# Patient Record
Sex: Male | Born: 1975
Health system: Southern US, Community
[De-identification: ages and names within clinical notes are randomized; demographics above are authoritative.]

## PROBLEM LIST (undated history)

## (undated) DIAGNOSIS — Q859 Phakomatosis, unspecified: Secondary | ICD-10-CM

## (undated) DIAGNOSIS — R030 Elevated blood-pressure reading, without diagnosis of hypertension: Secondary | ICD-10-CM

## (undated) DIAGNOSIS — K589 Irritable bowel syndrome without diarrhea: Secondary | ICD-10-CM

## (undated) DIAGNOSIS — G4700 Insomnia, unspecified: Secondary | ICD-10-CM

## (undated) DIAGNOSIS — E785 Hyperlipidemia, unspecified: Secondary | ICD-10-CM

## (undated) HISTORY — DX: Phakomatosis, unspecified: Q85.9

## (undated) HISTORY — PX: LUNG BIOPSY: SHX232

## (undated) HISTORY — DX: Elevated blood-pressure reading, without diagnosis of hypertension: R03.0

## (undated) HISTORY — DX: Irritable bowel syndrome, unspecified: K58.9

## (undated) HISTORY — DX: Hyperlipidemia, unspecified: E78.5

## (undated) HISTORY — DX: Insomnia, unspecified: G47.00

## (undated) HISTORY — PX: SIGMOIDOSCOPY: SUR1295

## (undated) HISTORY — PX: COLONOSCOPY: SHX174

---

## 2004-05-07 ENCOUNTER — Ambulatory Visit: Payer: Self-pay | Admitting: Internal Medicine

## 2004-09-05 ENCOUNTER — Ambulatory Visit: Payer: Self-pay | Admitting: Gastroenterology

## 2004-09-17 ENCOUNTER — Ambulatory Visit: Payer: Self-pay | Admitting: Gastroenterology

## 2004-10-11 ENCOUNTER — Ambulatory Visit (HOSPITAL_COMMUNITY): Admission: RE | Admit: 2004-10-11 | Discharge: 2004-10-11 | Payer: Self-pay | Admitting: Gastroenterology

## 2004-10-11 ENCOUNTER — Ambulatory Visit: Payer: Self-pay | Admitting: Gastroenterology

## 2004-10-11 ENCOUNTER — Encounter (INDEPENDENT_AMBULATORY_CARE_PROVIDER_SITE_OTHER): Payer: Self-pay | Admitting: Specialist

## 2005-02-26 ENCOUNTER — Ambulatory Visit: Payer: Self-pay | Admitting: Family Medicine

## 2005-04-04 ENCOUNTER — Ambulatory Visit: Payer: Self-pay | Admitting: Family Medicine

## 2005-09-03 ENCOUNTER — Ambulatory Visit: Payer: Self-pay | Admitting: Family Medicine

## 2006-04-09 LAB — HM COLONOSCOPY: HM Colonoscopy: NORMAL

## 2006-08-29 ENCOUNTER — Ambulatory Visit: Payer: Self-pay | Admitting: Family Medicine

## 2006-08-29 LAB — CONVERTED CEMR LAB
ALT: 47 units/L — ABNORMAL HIGH (ref 0–40)
AST: 32 units/L (ref 0–37)
Albumin: 4.2 g/dL (ref 3.5–5.2)
Alkaline Phosphatase: 68 units/L (ref 39–117)
BUN: 15 mg/dL (ref 6–23)
Basophils Absolute: 0 10*3/uL (ref 0.0–0.1)
Basophils Relative: 0.7 % (ref 0.0–1.0)
Bilirubin, Direct: 0.1 mg/dL (ref 0.0–0.3)
CO2: 31 meq/L (ref 19–32)
Calcium: 9.6 mg/dL (ref 8.4–10.5)
Chloride: 104 meq/L (ref 96–112)
Cholesterol: 275 mg/dL (ref 0–200)
Creatinine, Ser: 0.9 mg/dL (ref 0.4–1.5)
Direct LDL: 194.5 mg/dL
Eosinophils Absolute: 0.1 10*3/uL (ref 0.0–0.6)
Eosinophils Relative: 1.3 % (ref 0.0–5.0)
GFR calc Af Amer: 127 mL/min
GFR calc non Af Amer: 105 mL/min
Glucose, Bld: 104 mg/dL — ABNORMAL HIGH (ref 70–99)
HCT: 45.3 % (ref 39.0–52.0)
HDL: 39.6 mg/dL (ref >39.0)
Hemoglobin: 15.6 g/dL (ref 13.0–17.0)
Lymphocytes Relative: 25 % (ref 12.0–46.0)
MCHC: 34.3 g/dL (ref 30.0–36.0)
MCV: 90.2 fL (ref 78.0–100.0)
Monocytes Absolute: 0.5 10*3/uL (ref 0.2–0.7)
Monocytes Relative: 9.2 % (ref 3.0–11.0)
Neutro Abs: 3.1 10*3/uL (ref 1.4–7.7)
Neutrophils Relative %: 63.8 % (ref 43.0–77.0)
Platelets: 243 10*3/uL (ref 150–400)
Potassium: 4.3 meq/L (ref 3.5–5.1)
RBC: 5.03 M/uL (ref 4.22–5.81)
RDW: 12.6 % (ref 11.5–14.6)
Sodium: 141 meq/L (ref 135–145)
TSH: 1.32 microintl units/mL (ref 0.35–5.50)
Total Bilirubin: 0.9 mg/dL (ref 0.3–1.2)
Total CHOL/HDL Ratio: 6.9
Total Protein: 7.4 g/dL (ref 6.0–8.3)
Triglycerides: 182 mg/dL — ABNORMAL HIGH (ref 0–149)
VLDL: 36 mg/dL (ref 0–40)
WBC: 4.9 10*3/uL (ref 4.5–10.5)

## 2007-04-06 ENCOUNTER — Ambulatory Visit: Payer: Self-pay | Admitting: Internal Medicine

## 2007-04-06 DIAGNOSIS — R030 Elevated blood-pressure reading, without diagnosis of hypertension: Secondary | ICD-10-CM | POA: Insufficient documentation

## 2007-05-12 ENCOUNTER — Ambulatory Visit: Payer: Self-pay | Admitting: Family Medicine

## 2007-05-12 DIAGNOSIS — E785 Hyperlipidemia, unspecified: Secondary | ICD-10-CM | POA: Insufficient documentation

## 2007-05-12 DIAGNOSIS — E782 Mixed hyperlipidemia: Secondary | ICD-10-CM

## 2007-05-20 ENCOUNTER — Ambulatory Visit: Payer: Self-pay | Admitting: Internal Medicine

## 2007-05-20 LAB — CONVERTED CEMR LAB
ALT: 49 units/L (ref 0–53)
AST: 33 units/L (ref 0–37)
Albumin: 4.3 g/dL (ref 3.5–5.2)
Alkaline Phosphatase: 64 units/L (ref 39–117)
Bilirubin, Direct: 0.1 mg/dL (ref 0.0–0.3)
Cholesterol: 266 mg/dL (ref 0–200)
Direct LDL: 202.2 mg/dL
HDL: 42.4 mg/dL (ref >39.0)
Total Bilirubin: 1 mg/dL (ref 0.3–1.2)
Total CHOL/HDL Ratio: 6.3
Total Protein: 7.8 g/dL (ref 6.0–8.3)
Triglycerides: 139 mg/dL (ref 0–149)
VLDL: 28 mg/dL (ref 0–40)

## 2007-05-26 ENCOUNTER — Ambulatory Visit: Payer: Self-pay | Admitting: Family Medicine

## 2007-06-06 ENCOUNTER — Emergency Department (HOSPITAL_COMMUNITY): Admission: EM | Admit: 2007-06-06 | Discharge: 2007-06-06 | Payer: Self-pay | Admitting: Emergency Medicine

## 2007-10-27 ENCOUNTER — Encounter (INDEPENDENT_AMBULATORY_CARE_PROVIDER_SITE_OTHER): Payer: Self-pay | Admitting: *Deleted

## 2007-11-23 ENCOUNTER — Ambulatory Visit: Payer: Self-pay | Admitting: Family Medicine

## 2007-11-30 LAB — CONVERTED CEMR LAB
ALT: 46 U/L
AST: 28 U/L
Albumin: 4.3 g/dL
Alkaline Phosphatase: 61 U/L
Bilirubin, Direct: 0.1 mg/dL
Cholesterol: 248 mg/dL
Direct LDL: 165.7 mg/dL
HDL: 30.9 mg/dL — ABNORMAL LOW
Total Bilirubin: 1 mg/dL
Total CHOL/HDL Ratio: 8
Total Protein: 7.6 g/dL
Triglycerides: 313 mg/dL
VLDL: 63 mg/dL — ABNORMAL HIGH

## 2007-12-01 ENCOUNTER — Encounter (INDEPENDENT_AMBULATORY_CARE_PROVIDER_SITE_OTHER): Payer: Self-pay | Admitting: *Deleted

## 2007-12-22 ENCOUNTER — Ambulatory Visit: Payer: Self-pay | Admitting: Family Medicine

## 2008-02-15 ENCOUNTER — Ambulatory Visit: Payer: Self-pay | Admitting: Family Medicine

## 2008-03-03 ENCOUNTER — Encounter (INDEPENDENT_AMBULATORY_CARE_PROVIDER_SITE_OTHER): Payer: Self-pay | Admitting: *Deleted

## 2008-03-03 ENCOUNTER — Telehealth (INDEPENDENT_AMBULATORY_CARE_PROVIDER_SITE_OTHER): Payer: Self-pay | Admitting: *Deleted

## 2008-04-11 ENCOUNTER — Ambulatory Visit: Payer: Self-pay | Admitting: Family Medicine

## 2008-04-11 DIAGNOSIS — K589 Irritable bowel syndrome without diarrhea: Secondary | ICD-10-CM | POA: Insufficient documentation

## 2008-04-11 LAB — CONVERTED CEMR LAB
Bilirubin Urine: NEGATIVE
Blood in Urine, dipstick: NEGATIVE
Glucose, Urine, Semiquant: NEGATIVE
Ketones, urine, test strip: NEGATIVE
Nitrite: NEGATIVE
Protein, U semiquant: NEGATIVE
Specific Gravity, Urine: >=1.03
Urobilinogen, UA: NEGATIVE
WBC Urine, dipstick: NEGATIVE
pH: 5

## 2008-06-07 ENCOUNTER — Emergency Department (HOSPITAL_COMMUNITY): Admission: EM | Admit: 2008-06-07 | Discharge: 2008-06-07 | Payer: Self-pay | Admitting: Emergency Medicine

## 2008-07-05 ENCOUNTER — Ambulatory Visit: Payer: Self-pay | Admitting: Family Medicine

## 2008-10-03 ENCOUNTER — Ambulatory Visit: Payer: Self-pay | Admitting: Family Medicine

## 2008-10-10 ENCOUNTER — Encounter (INDEPENDENT_AMBULATORY_CARE_PROVIDER_SITE_OTHER): Payer: Self-pay | Admitting: *Deleted

## 2008-10-17 ENCOUNTER — Encounter (INDEPENDENT_AMBULATORY_CARE_PROVIDER_SITE_OTHER): Payer: Self-pay | Admitting: *Deleted

## 2009-03-17 ENCOUNTER — Ambulatory Visit: Payer: Self-pay | Admitting: Family Medicine

## 2009-03-27 ENCOUNTER — Ambulatory Visit: Payer: Self-pay | Admitting: Family Medicine

## 2009-04-03 ENCOUNTER — Telehealth: Payer: Self-pay | Admitting: Family Medicine

## 2009-04-03 LAB — CONVERTED CEMR LAB
ALT: 72 units/L — ABNORMAL HIGH (ref 0–53)
AST: 47 units/L — ABNORMAL HIGH (ref 0–37)
Albumin: 4.3 g/dL (ref 3.5–5.2)
Alkaline Phosphatase: 66 units/L (ref 39–117)
Bilirubin, Direct: 0 mg/dL (ref 0.0–0.3)
Cholesterol: 127 mg/dL (ref 0–200)
HDL: 38.2 mg/dL — ABNORMAL LOW (ref >39.00)
LDL Cholesterol: 64 mg/dL (ref 0–99)
Total Bilirubin: 0.8 mg/dL (ref 0.3–1.2)
Total CHOL/HDL Ratio: 3
Total Protein: 8 g/dL (ref 6.0–8.3)
Triglycerides: 123 mg/dL (ref 0.0–149.0)
VLDL: 24.6 mg/dL (ref 0.0–40.0)

## 2009-05-22 ENCOUNTER — Ambulatory Visit: Payer: Self-pay | Admitting: Family

## 2009-05-22 DIAGNOSIS — J019 Acute sinusitis, unspecified: Secondary | ICD-10-CM | POA: Insufficient documentation

## 2009-08-23 ENCOUNTER — Ambulatory Visit: Payer: Self-pay | Admitting: Internal Medicine

## 2009-08-23 DIAGNOSIS — Q859 Phakomatosis, unspecified: Secondary | ICD-10-CM | POA: Insufficient documentation

## 2010-03-20 ENCOUNTER — Ambulatory Visit: Payer: Self-pay | Admitting: Internal Medicine

## 2010-04-26 ENCOUNTER — Encounter (INDEPENDENT_AMBULATORY_CARE_PROVIDER_SITE_OTHER): Payer: Self-pay | Admitting: *Deleted

## 2010-05-24 ENCOUNTER — Telehealth (INDEPENDENT_AMBULATORY_CARE_PROVIDER_SITE_OTHER): Payer: Self-pay | Admitting: *Deleted

## 2010-06-08 ENCOUNTER — Ambulatory Visit: Payer: Self-pay | Admitting: Family Medicine

## 2010-06-08 DIAGNOSIS — D239 Other benign neoplasm of skin, unspecified: Secondary | ICD-10-CM | POA: Insufficient documentation

## 2010-06-08 DIAGNOSIS — M25559 Pain in unspecified hip: Secondary | ICD-10-CM | POA: Insufficient documentation

## 2010-06-08 LAB — CONVERTED CEMR LAB
Bilirubin Urine: NEGATIVE
Ketones, urine, test strip: NEGATIVE
Protein, U semiquant: NEGATIVE
Urobilinogen, UA: NEGATIVE

## 2010-06-15 ENCOUNTER — Telehealth (INDEPENDENT_AMBULATORY_CARE_PROVIDER_SITE_OTHER): Payer: Self-pay | Admitting: *Deleted

## 2010-06-15 LAB — CONVERTED CEMR LAB
Alkaline Phosphatase: 63 units/L (ref 39–117)
Basophils Absolute: 0.1 10*3/uL (ref 0.0–0.1)
Basophils Relative: 0.9 % (ref 0.0–3.0)
Bilirubin, Direct: 0.2 mg/dL (ref 0.0–0.3)
CO2: 29 meq/L (ref 19–32)
Calcium: 9.5 mg/dL (ref 8.4–10.5)
Cholesterol: 167 mg/dL (ref 0–200)
Creatinine, Ser: 0.9 mg/dL (ref 0.4–1.5)
Eosinophils Absolute: 0 10*3/uL (ref 0.0–0.7)
Lymphocytes Relative: 25 % (ref 12.0–46.0)
MCHC: 34.4 g/dL (ref 30.0–36.0)
Neutrophils Relative %: 65.6 % (ref 43.0–77.0)
Platelets: 269 10*3/uL (ref 150.0–400.0)
RBC: 4.75 M/uL (ref 4.22–5.81)
Total Bilirubin: 1.1 mg/dL (ref 0.3–1.2)
Total CHOL/HDL Ratio: 4
Triglycerides: 210 mg/dL — ABNORMAL HIGH (ref 0.0–149.0)
VLDL: 42 mg/dL — ABNORMAL HIGH (ref 0.0–40.0)

## 2010-07-05 ENCOUNTER — Ambulatory Visit
Admission: RE | Admit: 2010-07-05 | Discharge: 2010-07-05 | Payer: Self-pay | Source: Home / Self Care | Attending: Family Medicine | Admitting: Family Medicine

## 2010-07-05 ENCOUNTER — Encounter: Payer: Self-pay | Admitting: Family Medicine

## 2010-07-05 ENCOUNTER — Other Ambulatory Visit: Payer: Self-pay | Admitting: Family Medicine

## 2010-07-05 DIAGNOSIS — R740 Nonspecific elevation of levels of transaminase and lactic acid dehydrogenase [LDH]: Secondary | ICD-10-CM

## 2010-07-05 DIAGNOSIS — R7401 Elevation of levels of liver transaminase levels: Secondary | ICD-10-CM | POA: Insufficient documentation

## 2010-07-05 DIAGNOSIS — R7402 Elevation of levels of lactic acid dehydrogenase (LDH): Secondary | ICD-10-CM | POA: Insufficient documentation

## 2010-07-05 LAB — GAMMA GT: GGT: 82 U/L — ABNORMAL HIGH (ref 7–51)

## 2010-07-05 LAB — HEPATIC FUNCTION PANEL
ALT: 100 U/L — ABNORMAL HIGH (ref 0–53)
AST: 68 U/L — ABNORMAL HIGH (ref 0–37)
Albumin: 4.6 g/dL (ref 3.5–5.2)
Alkaline Phosphatase: 61 U/L (ref 39–117)
Bilirubin, Direct: 0.2 mg/dL (ref 0.0–0.3)
Total Bilirubin: 1 mg/dL (ref 0.3–1.2)
Total Protein: 7.8 g/dL (ref 6.0–8.3)

## 2010-07-06 ENCOUNTER — Telehealth (INDEPENDENT_AMBULATORY_CARE_PROVIDER_SITE_OTHER): Payer: Self-pay | Admitting: *Deleted

## 2010-07-09 LAB — CONVERTED CEMR LAB
Hep B C IgM: NEGATIVE
Hepatitis B Surface Ag: NEGATIVE

## 2010-07-12 ENCOUNTER — Encounter: Payer: Self-pay | Admitting: Family Medicine

## 2010-07-13 ENCOUNTER — Other Ambulatory Visit: Payer: Self-pay | Admitting: Dermatology

## 2010-07-17 ENCOUNTER — Encounter
Admission: RE | Admit: 2010-07-17 | Discharge: 2010-07-17 | Payer: Self-pay | Source: Home / Self Care | Attending: Family Medicine | Admitting: Family Medicine

## 2010-07-22 LAB — CONVERTED CEMR LAB
ALT: 59 units/L — ABNORMAL HIGH (ref 0–53)
ALT: 69 units/L — ABNORMAL HIGH (ref 0–53)
AST: 38 units/L — ABNORMAL HIGH (ref 0–37)
AST: 39 units/L — ABNORMAL HIGH (ref 0–37)
Albumin: 4.3 g/dL (ref 3.5–5.2)
Albumin: 4.4 g/dL (ref 3.5–5.2)
Alkaline Phosphatase: 52 units/L (ref 39–117)
Alkaline Phosphatase: 61 units/L (ref 39–117)
Bilirubin, Direct: 0 mg/dL (ref 0.0–0.3)
Bilirubin, Direct: 0.1 mg/dL (ref 0.0–0.3)
Total Bilirubin: 0.8 mg/dL (ref 0.3–1.2)
Total Bilirubin: 0.8 mg/dL (ref 0.3–1.2)
Total Protein: 7.7 g/dL (ref 6.0–8.3)
Total Protein: 7.9 g/dL (ref 6.0–8.3)

## 2010-07-24 NOTE — Letter (Signed)
Summary: Primary Care Appointment Letter  [facility]  [address] [place], [place] [address]   Phone: [phone]  Fax: [phone]    04/26/2010 MRN: [id number]  [facility] [address] [place], [place]  [address]  Dear Mr. [facility],   Your Primary Care Physician Loreen Freud DO has indicated that:    _______it is time to schedule an appointment.    _______you missed your appointment on______ and need to call and          reschedule.    ___X____you need to have lab work done.    _______you need to schedule an appointment discuss lab or test results.    _______you need to call to reschedule your appointment that is                       scheduled on _________.     Please call our office as soon as possible. Our phone number is [phone]. Please press option 1. Our office is open 8a-12noon and 1p-5p, Monday through Friday.     Thank you,    [facility]

## 2010-07-24 NOTE — Assessment & Plan Note (Signed)
Summary: sinus infection?/kdc   Vital Signs:  Patient profile:   35 year old male Weight:      240.6 pounds Temp:     98.6 degrees F oral Pulse rate:   92 / minute Resp:     16 per minute BP sitting:   114 / 86  (left arm) Cuff size:   large  Vitals Entered By: Shonna Chock (August 23, 2009 3:11 PM) CC: Sinus Infection Comments REVIEWED MED LIST, PATIENT AGREED DOSE AND INSTRUCTION CORRECT    Primary Care Provider:  Laury Axon  CC:  Sinus Infection.  History of Present Illness: Onset  08/19/2009 as head congestion followed by ear pressure . As of  02/28  purulence  from head. Rx: Advil Cold & Sinus , NSAIDS. PMH of Hamartoma RLL as per biopsy. No pulmonary symptoms with present illness.  Allergies: 1)  ! * Dust and Mold  Past History:  Past Medical History: Hyperlipidemia IBS (ICD-564.1) HYPERLIPIDEMIA (ICD-272.4) ELEVATED BLOOD PRESSURE WITHOUT DIAGNOSIS OF HYPERTENSION (ICD-796.2) Hamartoma , PMH of   Past Surgical History: lung biopsy FNA under Xray guidance 1998 @  [facility]  Review of Systems General:  Denies chills, fever, and weakness. ENT:  Complains of earache, nasal congestion, postnasal drainage, and sinus pressure; denies ear discharge; No frontal headache, facial pain . Resp:  Complains of cough and sputum productive; denies chest pain with inspiration, pleuritic, shortness of breath, and wheezing; Sputum is from PNDrainage. MS:  Complains of joint pain; denies joint redness and joint swelling; Arthralgias 03/01.  Physical Exam  General:  Appears fatigued but in no acute distress; alert,appropriate and cooperative throughout examination Ears:  External ear exam shows no significant lesions or deformities.  Otoscopic examination reveals clear canals, tympanic membranes are intact bilaterally without bulging, retraction, inflammation or discharge. Hearing is grossly normal bilaterally. Minimal erythema of TMs Nose:  External nasal examination shows no deformity or  inflammation. Nasal mucosa are  eryhthematous without lesions or exudates. Mouth:  Oral mucosa and oropharynx without lesions or exudates.  Teeth in good repair. Mild pharyngeal erythema.   Lungs:  Normal respiratory effort, chest expands symmetrically. Lungs are clear to auscultation, no crackles or wheezes. Cervical Nodes:  No lymphadenopathy noted Axillary Nodes:  No palpable lymphadenopathy   Impression & Recommendations:  Problem # 1:  SINUSITIS, ACUTE (ICD-461.9)  His updated medication list for this problem includes:    Amoxicillin-pot Clavulanate 875-125 Mg Tabs (Amoxicillin-pot clavulanate) .Marland Kitchen... 1 q 12 hrs with a meal    Fluticasone Propionate 50 Mcg/act Susp (Fluticasone propionate) .Marland Kitchen... 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 Take as directed 2)  Amoxicillin-pot Clavulanate 875-125 Mg Tabs (Amoxicillin-pot clavulanate) .Marland Kitchen.. 1 q 12 hrs with a meal 3)  Fluticasone Propionate 50 Mcg/act Susp (Fluticasone propionate) .Marland Kitchen.. 1 spray two times a day prn  Patient Instructions: 1)  Neti pot once daily until sinuses clear. 2)  Drink as much fluid as you can tolerate for the next few days. Prescriptions: FLUTICASONE PROPIONATE 50 MCG/ACT SUSP (FLUTICASONE PROPIONATE) 1 spray two times a day prn  #1 x 5   Entered and Authorized by:   Marga Melnick MD   Signed by:   Marga Melnick MD on 08/23/2009   Method used:   Faxed to ...       [facility] #[id number]* (retail)  [address]       [place], [place]  [address]       Ph: [phone]       Fax: [phone]   RxID:   [id number] AMOXICILLIN-POT CLAVULANATE 875-125 MG TABS (AMOXICILLIN-POT CLAVULANATE) 1 q 12 hrs with a meal  #20 x 0   Entered and Authorized by:   Marga Melnick MD   Signed by:   Marga Melnick MD on 08/23/2009   Method used:    Faxed to ...       [facility] #[id number]* (retail)       [address]       [place], [place]  [address]       Ph: [phone]       Fax: [phone]   RxID:   [id number]

## 2010-07-24 NOTE — Progress Notes (Signed)
Summary: REFILL  Phone Note Call from Patient   Caller: Patient Summary of Call: PT HAS SCHEDULED A CPX FOR 06/08/10. PLEASE FILL HIS RX FOR CRESTOR. [facility] [address] Initial call taken by: Lavell Islam,  May 24, 2010 3:43 PM    Prescriptions: CRESTOR 20 MG TABS (ROSUVASTATIN CALCIUM) take as directed**LABS DUE NOW BEFORE FURTHER REFILLS WILL BE GIVEN  #15 x 0   Entered by:   Almeta Monas CMA (AAMA)   Authorized by:   Loreen Freud DO   Signed by:   Almeta Monas CMA (AAMA) on 05/24/2010   Method used:   Electronically to        [facility] #[id number]* (retail)       [address]       [place], [place]  [address]       Ph: [phone]       Fax: [phone]   RxID:   [id number]

## 2010-07-24 NOTE — Assessment & Plan Note (Signed)
Summary: HEAD CONGESTION/RH......Marland Kitchen   Vital Signs:  Patient profile:   35 year old male Height:      70 inches Weight:      240 pounds O2 Sat:      93 % on Room air Temp:     97.9 degrees F oral Pulse rate:   107 / minute Resp:     16 per minute BP sitting:   120 / 78  (left arm)  Vitals Entered By: Jeremy Johann CMA (March 20, 2010 4:56 PM)  O2 Flow:  Room air CC: head congestion, cough yellow, green mucous, drainage x3days, URI symptoms   Primary Care Provider:  Laury Axon  CC:  head congestion, cough yellow, green mucous, drainage x3days, and URI symptoms.  History of Present Illness: URI Symptoms      This is a 35 year old man who presents with URI symptoms since 03/16/2010..  The patient reports nasal congestion, purulent nasal discharge, and productive cough, but denies sore throat and earache.  The patient denies fever.  The patient also reports headache.  Risk factors for Strep sinusitis include  some bilateral facial pain.  The patient denies the following risk factors for Strep sinusitis: tender adenopathy.  QV:ZDGL pot , Mucinex ,  Current Medications (verified): 1)  Crestor 20 Mg Tabs (Rosuvastatin Calcium) .... Take As Directed  Allergies (verified): 1)  ! * Dust and Mold  Physical Exam  General:  well-nourished,in no acute distress; alert,appropriate and cooperative throughout examination Ears:  External ear exam shows no significant lesions or deformities.  Otoscopic examination reveals clear canals, tympanic membranes are intact bilaterally without bulging, retraction, inflammation or discharge. Hearing is grossly normal bilaterally. Nose:  External nasal examination shows no deformity or inflammation. Nasal mucosa are mildly erythematous without lesions or exudates. Mouth:  Oral mucosa and oropharynx without lesions or exudates.  Teeth in good repair. Lungs:  Normal respiratory effort, chest expands symmetrically. Lungs are clear to auscultation, no crackles  or wheezes. Cervical Nodes:  No lymphadenopathy noted Axillary Nodes:  No palpable lymphadenopathy   Impression & Recommendations:  Problem # 1:  SINUSITIS, ACUTE (ICD-461.9)  The following medications were removed from the medication list:    Amoxicillin-pot Clavulanate 875-125 Mg Tabs (Amoxicillin-pot clavulanate) .Marland Kitchen... 1 q 12 hrs with a meal    Fluticasone Propionate 50 Mcg/act Susp (Fluticasone propionate) .Marland Kitchen... 1 spray two times a day prn His updated medication list for this problem includes:    Amoxicillin-pot Clavulanate 875-125 Mg Tabs (Amoxicillin-pot clavulanate) .Marland Kitchen... 1 every 12 hrs with a meal    Fluticasone Propionate 50 Mcg/act Susp (Fluticasone propionate) .Marland Kitchen... 1 spray two times a day as crossover  Complete Medication List: 1)  Crestor 20 Mg Tabs (Rosuvastatin calcium) .... Take as directed 2)  Amoxicillin-pot Clavulanate 875-125 Mg Tabs (Amoxicillin-pot clavulanate) .Marland Kitchen.. 1 every 12 hrs with a meal 3)  Fluticasone Propionate 50 Mcg/act Susp (Fluticasone propionate) .Marland Kitchen.. 1 spray two times a day as crossover  Patient Instructions: 1)  Protective mask & Neti pot  as discussed to prevent cycle. 2)  Drink as much NON dairy  fluid as you can tolerate for the next few days. 3)  Take 650-1000mg  of Tylenol every 4-6 hours as needed for relief of pain or comfort of fever AVOID taking more than 4000mg   in a 24 hour period (can cause liver damage in higher doses) OR take 400-600mg  of Ibuprofen (Advil, Motrin) with food every 4-6 hours as needed for relief of pain or comfort of fever.  Prescriptions: FLUTICASONE PROPIONATE 50 MCG/ACT SUSP (FLUTICASONE PROPIONATE) 1 spray two times a day as crossover  #1 x 5   Entered and Authorized by:   Marga Melnick MD   Signed by:   Marga Melnick MD on 03/20/2010   Method used:   Faxed to ...       [facility] [phone]* (retail)       [address]       [place], [place]  [address]       Ph:  [phone]       Fax: [phone]   RxID:   [id number] AMOXICILLIN-POT CLAVULANATE 875-125 MG TABS (AMOXICILLIN-POT CLAVULANATE) 1 every 12 hrs with a meal  #20 x 0   Entered and Authorized by:   Marga Melnick MD   Signed by:   Marga Melnick MD on 03/20/2010   Method used:   Faxed to ...       [facility] #[id number]* (retail)       [address]       [place], [place]  [address]       Ph: [phone]       Fax: [phone]   RxID:   [id number]

## 2010-07-26 NOTE — Assessment & Plan Note (Signed)
Summary: CPX/FASTING/KN   Vital Signs:  Patient profile:   35 year old male Height:      70 inches Weight:      246.8 pounds BMI:     35.54 Temp:     97.6 degrees F oral Pulse rate:   88 / minute Pulse rhythm:   regular BP sitting:   122 / 84  (right arm) Cuff size:   large  Vitals Entered By: Almeta Monas CMA Duncan Dull) (June 08, 2010 10:02 AM) CC: Cpx.fasting--no problems   History of Present Illness: Pt here for cpe and labs.   Pt c/o Left hip and L knee and low back pain .  No known injury.   It started with hip pain and then he noticed his knee hurt--back in June/ July and in august his back started to hurt.   Pt admits to not exercising and needing to lose weight.     Preventive Screening-Counseling & Management  Alcohol-Tobacco     Alcohol type: very rare     Smoking Status: never     Passive Smoke Exposure: no  Caffeine-Diet-Exercise     Caffeine use/day: 5+     Does Patient Exercise: yes     Type of exercise: walking     Times/week: 1  Hep-HIV-STD-Contraception     HIV Risk: no  Safety-Violence-Falls     Seat Belt Use: 100  Problems Prior to Update: 1)  Nevi, Multiple  (ICD-216.9) 2)  Preventive Health Care  (ICD-V70.0) 3)  Other Congenital Hamartoses Nec  (ICD-759.6) 4)  Sinusitis, Acute  (ICD-461.9) 5)  Family History Diabetes 1st Degree Relative  (ICD-V18.0) 6)  Ibs  (ICD-564.1) 7)  Hyperlipidemia  (ICD-272.4) 8)  Elevated Blood Pressure Without Diagnosis of Hypertension  (ICD-796.2)  Medications Prior to Update: 1)  Crestor 20 Mg Tabs (Rosuvastatin Calcium) .... Take As Directed**labs Due Now Before Further Refills Will Be Given 2)  Amoxicillin-Pot Clavulanate 875-125 Mg Tabs (Amoxicillin-Pot Clavulanate) .Marland Kitchen.. 1 Every 12 Hrs With A Meal 3)  Fluticasone Propionate 50 Mcg/act Susp (Fluticasone Propionate) .Marland Kitchen.. 1 Spray Two Times A Day As Crossover  Current Medications (verified): 1)  Crestor 20 Mg Tabs (Rosuvastatin Calcium) .... Take As  Directed**labs Due Now Before Further Refills Will Be Given 2)  Wellbutrin Xl 150 Mg Xr24h-Tab (Bupropion Hcl) .Marland Kitchen.. 1 By Mouth Once Daily For 1 Week Then Increase To 2 Tab Daily  Allergies (verified): 1)  ! * Dust and Mold  Past History:  Past Medical History: Last updated: 08/23/2009 Hyperlipidemia IBS (ICD-564.1) HYPERLIPIDEMIA (ICD-272.4) ELEVATED BLOOD PRESSURE WITHOUT DIAGNOSIS OF HYPERTENSION (ICD-796.2) Hamartoma , PMH of   Past Surgical History: Last updated: 08/23/2009 lung biopsy FNA under Xray guidance 1998 @  [place]  Family History: Last updated: 04/11/2008 Family History Diabetes 1st degree relative Family History High cholesterol) PGM---  cancer?  (digestive) Family History Kidney disease  Social History: Last updated: 04/11/2008 Occupation:  [employer] Mar[employer] Never Smoked Alcohol use-yes Drug use-no Regular exercise-yes  Risk Factors: Caffeine Use: 5+ (06/08/2010) Exercise: yes (06/08/2010)  Risk Factors: Smoking Status: never (06/08/2010) Passive Smoke Exposure: no (06/08/2010)  Family History: Reviewed history from 04/11/2008 and no changes required. Family History Diabetes 1st degree relative Family History High cholesterol) PGM---  cancer?  (digestive) Family History Kidney disease  Social History: Reviewed history from 04/11/2008 and no changes required. Occupation:  [employer] Mar[employer] Never Smoked Alcohol use-yes Drug use-no Regular exercise-yes  Review of Systems      See  HPI General:  Denies chills, fatigue, fever, loss of appetite, malaise, sleep disorder, sweats, weakness, and weight loss. Eyes:  Denies blurring, discharge, double vision, eye irritation, eye pain, halos, itching, light sensitivity, red eye, vision loss-1 eye, and vision loss-both eyes; optho-- q1y. ENT:  Denies decreased hearing, difficulty swallowing, ear discharge, earache, hoarseness, nasal congestion, nosebleeds,  postnasal drainage, ringing in ears, sinus pressure, and sore throat. CV:  Denies bluish discoloration of lips or nails, chest pain or discomfort, difficulty breathing at night, difficulty breathing while lying down, fainting, fatigue, leg cramps with exertion, lightheadness, near fainting, palpitations, shortness of breath with exertion, swelling of feet, swelling of hands, and weight gain. Resp:  Denies chest discomfort, chest pain with inspiration, cough, coughing up blood, excessive snoring, hypersomnolence, morning headaches, pleuritic, shortness of breath, sputum productive, and wheezing. GI:  Denies abdominal pain, bloody stools, change in bowel habits, constipation, dark tarry stools, diarrhea, excessive appetite, gas, hemorrhoids, indigestion, loss of appetite, nausea, vomiting, vomiting blood, and yellowish skin color. GU:  Denies decreased libido, discharge, dysuria, erectile dysfunction, genital sores, hematuria, incontinence, nocturia, urinary frequency, and urinary hesitancy. MS:  Complains of joint pain and low back pain; denies joint redness, joint swelling, loss of strength, mid back pain, muscle aches, muscle , cramps, muscle weakness, stiffness, and thoracic pain. Derm:  Denies changes in color of skin, changes in nail beds, dryness, excessive perspiration, flushing, hair loss, insect bite(s), itching, lesion(s), poor wound healing, and rash. Neuro:  Denies brief paralysis, difficulty with concentration, disturbances in coordination, falling down, headaches, inability to speak, memory loss, numbness, poor balance, seizures, sensation of room spinning, tingling, tremors, visual disturbances, and weakness. Psych:  Denies alternate hallucination ( auditory/visual), anxiety, depression, easily angered, easily tearful, irritability, mental problems, panic attacks, sense of great danger, suicidal thoughts/plans, thoughts of violence, unusual visions or sounds, and thoughts /plans of harming  others. Endo:  Denies cold intolerance, excessive hunger, excessive thirst, excessive urination, heat intolerance, polyuria, and weight change. Heme:  Denies abnormal bruising, bleeding, enlarge lymph nodes, fevers, pallor, and skin discoloration. Allergy:  Denies hives or rash, itching eyes, persistent infections, seasonal allergies, and sneezing.  Physical Exam  General:  Well-developed,well-nourished,in no acute distress; alert,appropriate and cooperative throughout examination Head:  Normocephalic and atraumatic without obvious abnormalities. No apparent alopecia or balding. Eyes:  vision grossly intact, pupils equal, pupils round, pupils reactive to light, and no injection.   Ears:  External ear exam shows no significant lesions or deformities.  Otoscopic examination reveals clear canals, tympanic membranes are intact bilaterally without bulging, retraction, inflammation or discharge. Hearing is grossly normal bilaterally. Nose:  External nasal examination shows no deformity or inflammation. Nasal mucosa are pink and moist without lesions or exudates. Mouth:  Oral mucosa and oropharynx without lesions or exudates.  Teeth in good repair. Neck:  No deformities, masses, or tenderness noted. Lungs:  Normal respiratory effort, chest expands symmetrically. Lungs are clear to auscultation, no crackles or wheezes. Heart:  normal rate and no murmur.   Abdomen:  Bowel sounds positive,abdomen soft and non-tender without masses, organomegaly or hernias noted. Genitalia:  Testes bilaterally descended without nodularity, tenderness or masses. No scrotal masses or lesions. No penis lesions or urethral discharge. Msk:  normal ROM, no joint tenderness, no joint swelling, no joint warmth, no redness over joints, no joint deformities, no joint instability, and no crepitation.   Pulses:  R and L carotid,radial,femoral,dorsalis pedis and posterior tibial pulses are full and equal bilaterally Extremities:  No  clubbing, cyanosis,  edema, or deformity noted with normal full range of motion of all joints.   Neurologic:  alert & oriented X3, strength normal in all extremities, and gait normal.   Skin:  Intact without suspicious lesions or rashes Cervical Nodes:  No lymphadenopathy noted Axillary Nodes:  No palpable lymphadenopathy Psych:  Cognition and judgment appear intact. Alert and cooperative with normal attention span and concentration. No apparent delusions, illusions, hallucinations   Impression & Recommendations:  Problem # 1:  PREVENTIVE HEALTH CARE (ICD-V70.0)  Orders: Venipuncture ([address]) TLB-Lipid Panel (80061-LIPID) TLB-BMP (Basic Metabolic Panel-BMET) (80048-METABOL) TLB-CBC Platelet - w/Differential (85025-CBCD) TLB-Hepatic/Liver Function Pnl (80076-HEPATIC) TLB-TSH (Thyroid Stimulating Hormone) (84443-TSH) UA Dipstick W/ Micro (manual) (60454)  Problem # 2:  HYPERLIPIDEMIA (ICD-272.4)  His updated medication list for this problem includes:    Crestor 20 Mg Tabs (Rosuvastatin calcium) .Marland Kitchen... 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   Complete Medication List: 1)  Crestor 20 Mg Tabs (Rosuvastatin calcium) .... Take as directed**labs due now before further refills will be given 2)  Wellbutrin Xl 150 Mg Xr24h-tab (Bupropion hcl) .Marland Kitchen.. 1 by mouth once daily for 1 week then increase to 2 tab daily  Other Orders: Dermatology Referral (Derma)  Patient Instructions: 1)  Please  schedule a follow-up appointment in 6 months .  Prescriptions: WELLBUTRIN XL 150 MG XR24H-TAB (BUPROPION HCL) 1 by mouth once daily for 1 week then increase to 2 tab daily  #60 x 0   Entered and Authorized by:   Loreen Freud DO   Signed by:   Loreen Freud DO on 06/08/2010   Method used:   Electronically to        [facility] [phone]* (retail)       [address]       [place], [place]  [address]       Ph: [phone]       Fax: [phone]   RxID:   [id number]    Orders Added: 1)  T-2 View CXR [71020TC] 2)  Venipuncture [34742] 3)  TLB-Lipid Panel [80061-LIPID] 4)  TLB-BMP (Basic Metabolic Panel-BMET) [80048-METABOL] 5)  TLB-CBC Platelet - w/Differential [85025-CBCD] 6)  TLB-Hepatic/Liver Function Pnl [80076-HEPATIC] 7)  TLB-TSH (Thyroid Stimulating Hormone) [84443-TSH] 8)  Est. Patient 18-39 years [99395] 89)  Dermatology Referral [Derma] 10)  UA Dipstick W/ Micro (manual) [81000]    Flu Vaccine Result Date:  04/04/2010 Flu Vaccine Result:  given Flu Vaccine Next Due:  1 yr TD Result Date:  01/05/2003 TD Result:  given TD Next Due:  10 yr   Laboratory Results   Urine Tests   Date/Time Reported: June 08, 2010 11:27 AM   Routine Urinalysis   Color: yellow Appearance: Clear Glucose: negative   (Normal Range: Negative) Bilirubin: negative   (Normal Range: Negative) Ketone: negative   (Normal Range: Negative) Spec. Gravity: >=1.030   (Normal Range: 1.003-1.035) Blood: negative   (Normal Range: Negative) pH: 5.0   (Normal Range: 5.0-8.0) Protein: negative   (Normal Range: Negative) Urobilinogen: negative   (Normal Range: 0-1) Nitrite: negative   (  Normal Range: Negative) Leukocyte Esterace: negative   (Normal Range: Negative)    Comments: Floydene Flock  June 08, 2010 11:28 AM

## 2010-07-26 NOTE — Progress Notes (Signed)
Summary: Results   Phone Note Outgoing Call   Call placed by: Almeta Monas CMA Duncan Dull),  June 15, 2010 2:56 PM Call placed to: Patient Details for Reason: Ideally your LDL (bad cholesterol) should be <_100___, your HDL (good cholesterol) should be >__40_ and your triglycerides should be< 150.  Diet and exercise will increase HDL and decrease the LDL and triglycerides. Read Dr. Danice Goltz book--Eat Drink and Be Healthy.  Con't Crestor.   Your Liver enzymes are elvated as well.   No ETOH or Tylenol?    We will recheck labs for cholesterol in _3__ months.   272.4  [facility] Recheck liver enzymes in 2 weeks  790.4  hep, ggt, acute hep  Summary of Call: Left message to call back..... Almeta Monas CMA Duncan Dull)  June 15, 2010 2:56 PM   Follow-up for Phone Call        Discuss with patient will call on tuesday to schedule repeat lab appt............Marland KitchenFelecia Deloach CMA  June 15, 2010 3:35 PM      Appended Document: Results  Pt notes that he has had a increase in ETOH but not in tylenol. Pt advise to monitor ETOH intake and repeat labs, pt verbalized understanding .

## 2010-07-26 NOTE — Letter (Signed)
Summary: Generic Letter  [facility]  [address] [place], [place] [address]   Phone: [phone]  Fax: [phone]    07/12/2010    [facility] [address] [place], [place]  [address]      Dear Mr. Schubring,   I have tried to contact you in reference to your most recent phone call. Please contact me [phone]. I will be available Monday-Friday from 8am until 5pm. I look forward to hearing from you.            Sincerely,   Almeta Monas CMA (AAMA)

## 2010-07-26 NOTE — Progress Notes (Signed)
Summary: Med Concerns--lmom --1/13, 1/17,1/18  Phone Note Call from Patient Call back at Home Phone [phone]   Caller: Patient Summary of Call: Patient called this morning to let us know he say his psychiatrist yesterday and they started him on Adderall. He wanted to make sure this was ok with you. Please advise.  Initial call taken by: Harold Barban,  July 06, 2010 10:39 AM  Follow-up for Phone Call        Yes that is fine---does he know the dose?  We should add it to his med list Follow-up by: Loreen Freud DO,  July 06, 2010 12:13 PM  Additional Follow-up for Phone Call Additional follow up Details #1::        Left message to call back..... Almeta Monas CMA Duncan Dull)  July 06, 2010 3:20 PM  Left message to call back... Almeta Monas CMA [facility] Dull)  July 10, 2010 3:33 PM  Left message to call back... Almeta Monas CMA [facility] Dull)  July 11, 2010 3:58 PM     Additional Follow-up for Phone Call Additional follow up Details #2::    3 attempts to contact patient with no success...Marland KitchenMarland KitchenMarland Kitchenletter mailed Follow-up by: Almeta Monas CMA Duncan Dull),  July 12, 2010 11:57 AM

## 2010-07-26 NOTE — Letter (Signed)
Summary: Proof of Physical Form/[facility]  Proof of Physical Form/[facility]   Imported By: Lanelle Bal 06/19/2010 07:47:14  _____________________________________________________________________  External Attachment:    Type:   Image     Comment:   External Document

## 2010-10-16 ENCOUNTER — Other Ambulatory Visit: Payer: Self-pay

## 2010-10-18 ENCOUNTER — Other Ambulatory Visit: Payer: Self-pay

## 2010-10-24 ENCOUNTER — Other Ambulatory Visit (INDEPENDENT_AMBULATORY_CARE_PROVIDER_SITE_OTHER): Payer: 59

## 2010-10-24 DIAGNOSIS — E785 Hyperlipidemia, unspecified: Secondary | ICD-10-CM

## 2010-10-24 LAB — HEPATIC FUNCTION PANEL
ALT: 59 U/L — ABNORMAL HIGH (ref 0–53)
Bilirubin, Direct: 0.1 mg/dL (ref 0.0–0.3)
Total Protein: 7.5 g/dL (ref 6.0–8.3)

## 2010-11-20 ENCOUNTER — Other Ambulatory Visit: Payer: Self-pay | Admitting: Family Medicine

## 2010-11-20 ENCOUNTER — Ambulatory Visit (INDEPENDENT_AMBULATORY_CARE_PROVIDER_SITE_OTHER)
Admission: RE | Admit: 2010-11-20 | Discharge: 2010-11-20 | Disposition: A | Discharge status: Home / Self Care | Payer: 59 | Source: Ambulatory Visit | Attending: Internal Medicine | Admitting: Internal Medicine

## 2010-11-20 ENCOUNTER — Encounter: Payer: Self-pay | Admitting: Internal Medicine

## 2010-11-20 ENCOUNTER — Ambulatory Visit (INDEPENDENT_AMBULATORY_CARE_PROVIDER_SITE_OTHER): Payer: 59 | Admitting: Internal Medicine

## 2010-11-20 VITALS — BP 130/84 | HR 95 | Wt 221.8 lb

## 2010-11-20 DIAGNOSIS — R102 Pelvic and perineal pain: Secondary | ICD-10-CM

## 2010-11-20 DIAGNOSIS — N419 Inflammatory disease of prostate, unspecified: Secondary | ICD-10-CM

## 2010-11-20 DIAGNOSIS — Z720 Tobacco use: Secondary | ICD-10-CM

## 2010-11-20 DIAGNOSIS — Q859 Phakomatosis, unspecified: Secondary | ICD-10-CM

## 2010-11-20 DIAGNOSIS — N390 Urinary tract infection, site not specified: Secondary | ICD-10-CM

## 2010-11-20 DIAGNOSIS — R109 Unspecified abdominal pain: Secondary | ICD-10-CM

## 2010-11-20 DIAGNOSIS — F17200 Nicotine dependence, unspecified, uncomplicated: Secondary | ICD-10-CM

## 2010-11-20 LAB — POCT URINALYSIS DIPSTICK
Blood, UA: NEGATIVE
Ketones, UA: NEGATIVE
Leukocytes, UA: NEGATIVE
Protein, UA: NEGATIVE
pH, UA: 6.5

## 2010-11-20 NOTE — Patient Instructions (Signed)
Lozenge: Oral: 2-mg strength is recommended. Use according to the following 12-week dosing schedule: Weeks 1-6: One lozenge every 1-2 hours  Weeks 7-9: One lozenge every 2-4 hours  Weeks 10-12: One lozenge every 4-8 hours

## 2010-11-20 NOTE — Progress Notes (Signed)
Subjective:    Patient ID: Kevin Mueller, male    DOB: 06/14/1976, 35 y.o.   MRN: [id number]  HPI Here with his wife, several issues. For 3 months he is having on and off difficulty urinating. Symptoms are not consistent, only one time he had extremity painful urination. 6 months ago, he developed pain in the sides of the hips, his primary care doctor recommended weight loss which he did, he lost about 30 pounds. The pain resolve but now he has steady pain in the iliac crest ,  pain is not worse by walking and this started 3 months ago. Reports ongoing issues with loose stools, diarrhea very frequently red blood in the stools. Status post 2 colonoscopies and a flex sigmoidoscopy "nobody has figured this out" Reports also that his work schedule keep him busy, unable to sleep more than 4 or 5 hours a night.  Past Medical History  Diagnosis Date  . Hyperlipidemia   . IBS (irritable bowel syndrome)   . Elevated blood pressure (not hypertension)   . Hamartoma    Past Surgical History  Procedure Date  . Lung biopsy     FNA under xray guidance 1998 @ [facility]      Review of Systems Denies nocturia, urinary urgency, no dysuria all other than the single episode described above. No gross hematuria, flank pain, no history of bladder or kidney stones. Denies any other joint aches such as knee, shoulder or elbow. No lower extremity edema. No fevers. Despite his heavy work schedule he is not depressed, he is of course to stress but nothing unexpected. The one thing that is new to him is that he started Adderall 3 months ago. He quit tobacco ~ 2 years ago and since then he uses  a lot  of Nicorette.     Objective:   Physical Exam  Constitutional: He is oriented to person, place, and time. He appears well-developed and well-nourished. No distress.  Cardiovascular: Normal rate, regular rhythm and normal heart sounds.   No murmur heard. Pulmonary/Chest: Effort normal. No respiratory distress. He  has no wheezes. He has no rales.  Abdominal: Soft. He exhibits no distension. There is no tenderness. There is no rebound and no guarding.  Genitourinary: Rectum normal.       Prostate normal size, no nodules, slightly tender on the right side?  Musculoskeletal: He exhibits no edema.       Arms, wrists and hands normal to inspection on palpation. Hips normal full rotation without any pain. He is not tender at the trochanteric bursa is. He points today @ the  iliac crests bilaterally as the area of pain in the "hip" ----> the area is palpated and I did not notice any abnormality.   Neurological: He is alert and oriented to person, place, and time.  Psychiatric: He has a normal mood and affect. His behavior is normal. Judgment and thought content normal.          Assessment & Plan:  Here with several issues 1.  Prostatitis? He has a number of urinary symptoms, symptoms are inconsistent. We'll get a PSA and a urine culture. Consider empiric antibiotic treatment even if labs normal because he was slightly tender on the right side of the prostate. 2. pain at both iliac crest, will get x-ray and labs. Don't believe his symptoms are related to recent initiation of adderall (patient was concerned about it) 3. Niccotine abuse: Also admits to high use of Nicorette, rec to decrease gradually ,  he uses the gum, at the instructions I put a weaning program for lozanges. He could do something similar.  Reassess in 4 weeks.

## 2010-11-21 ENCOUNTER — Telehealth: Payer: Self-pay | Admitting: *Deleted

## 2010-11-21 DIAGNOSIS — R918 Other nonspecific abnormal finding of lung field: Secondary | ICD-10-CM

## 2010-11-21 LAB — SEDIMENTATION RATE: Sed Rate: 8 mm/hr (ref 0–22)

## 2010-11-21 LAB — CBC WITH DIFFERENTIAL/PLATELET
Basophils Absolute: 0 10*3/uL (ref 0.0–0.1)
Eosinophils Absolute: 0 10*3/uL (ref 0.0–0.7)
HCT: 39.7 % (ref 39.0–52.0)
Lymphs Abs: 1.4 10*3/uL (ref 0.7–4.0)
MCHC: 34.5 g/dL (ref 30.0–36.0)
Monocytes Absolute: 0.3 10*3/uL (ref 0.1–1.0)
Monocytes Relative: 6.5 % (ref 3.0–12.0)
Platelets: 249 10*3/uL (ref 150.0–400.0)
RDW: 13.4 % (ref 11.5–14.6)

## 2010-11-21 NOTE — Telephone Encounter (Signed)
Pulled paper chart and patient has had a CXR every 5 years. His Hamartoma was 5 cm and is now 3 cm. He wanted to know is there something going on to now prompt a CT scan. I left chart on the ledge for review when you get back     KP

## 2010-11-21 NOTE — Telephone Encounter (Signed)
The office received call from imaging center about pt CXR. They note:  3 cm right lower lobe mass (posterior). Differential includes carcinoma of the lung and acute infection. CT chest with contrast suggested.

## 2010-11-21 NOTE — Telephone Encounter (Signed)
mssg left for patient to call back to make aware    KP

## 2010-11-21 NOTE — Telephone Encounter (Signed)
Order entered

## 2010-11-21 NOTE — Telephone Encounter (Signed)
I received results yesterday.  CT with contrast needs to be ordered.

## 2010-11-22 LAB — CULTURE, URINE COMPREHENSIVE: Organism ID, Bacteria: NO GROWTH

## 2010-11-22 NOTE — Telephone Encounter (Signed)
It looks like the films were done at [facility]     KP

## 2010-11-22 NOTE — Telephone Encounter (Signed)
I called his past CXR's were done at [facility] but the actual films are not available for comparison. Please advise    KP

## 2010-11-22 NOTE — Telephone Encounter (Signed)
Its the same radiologists as the hospital---please ask them to compare with old films ----if lesion is smaller is CT still needed.

## 2010-11-22 NOTE — Telephone Encounter (Signed)
No--- if it has gotten smaller then nothing more needs to be done.  Were other cxr done at [facility]?  Radiologist was concerned about cancer.  Make sure they compared xray with previous films.  It didn't say in report.

## 2010-11-23 ENCOUNTER — Telehealth: Payer: Self-pay | Admitting: *Deleted

## 2010-11-23 NOTE — Telephone Encounter (Signed)
Message left for patient to return my call.  

## 2010-11-23 NOTE — Telephone Encounter (Signed)
Message copied by Leanne Lovely on Fri Nov 23, 2010  9:53 AM ------      Message from: Willow Ora E      Created: Thu Nov 22, 2010  6:24 PM       Advise patient:      No evidence of prostatitis, urine culture negative.      Reassess symptoms in 4 weeks as planned

## 2010-11-23 NOTE — Telephone Encounter (Signed)
Spoke w/ pt aware of xray results and labs.

## 2010-11-26 NOTE — Telephone Encounter (Signed)
Pt never had CT---  And radiologist never recommended CT before----CT should be done

## 2010-11-26 NOTE — Telephone Encounter (Signed)
Tried calling patient no answer on home phone and work line is busy    KP

## 2010-11-26 NOTE — Telephone Encounter (Signed)
Tried calling home number again and the line just rang--will try again tomorrow     KP

## 2010-11-29 NOTE — Telephone Encounter (Signed)
Spoke with patient and advised of Dr.Lowne's recommendations and  He voiced understanding, would like to have CT done July 10 th around 3 pm, I advised I would put in the order and he voiced understanding.  The actual order is still pending in the system, spoke with [facility] and she agreed to get the CT scheduled     KP

## 2010-12-06 ENCOUNTER — Other Ambulatory Visit: Payer: Self-pay | Admitting: Family Medicine

## 2010-12-08 ENCOUNTER — Encounter: Payer: Self-pay | Admitting: Family Medicine

## 2010-12-18 ENCOUNTER — Ambulatory Visit (INDEPENDENT_AMBULATORY_CARE_PROVIDER_SITE_OTHER): Payer: 59 | Admitting: Family Medicine

## 2010-12-18 ENCOUNTER — Encounter: Payer: Self-pay | Admitting: Family Medicine

## 2010-12-18 DIAGNOSIS — M25559 Pain in unspecified hip: Secondary | ICD-10-CM

## 2010-12-18 DIAGNOSIS — F988 Other specified behavioral and emotional disorders with onset usually occurring in childhood and adolescence: Secondary | ICD-10-CM

## 2010-12-18 DIAGNOSIS — M25551 Pain in right hip: Secondary | ICD-10-CM

## 2010-12-18 NOTE — Assessment & Plan Note (Signed)
adderall may be causing him to have difficulty urinating---take a holiday off adderall and f/u Dr Evelene Croon

## 2010-12-18 NOTE — Assessment & Plan Note (Signed)
Try not to sit in one position for long periods of time  Try to move around Call or rto prn xrays reviewed

## 2010-12-18 NOTE — Progress Notes (Signed)
  Subjective:    Patient ID: Kevin Mueller, male    DOB: 1975/10/29, 35 y.o.   MRN: [id number]  HPI  Pt here to f/u from last visit.  Pt still has a "shy bladder"  And hips still sore.  Pt admits to taking adderall 4x a day and his friends told him they are on same med and have similar problems.  Pt also going through stressfull time right now and is working on couch at home and sits for long periods of time.   Review of Systems    as above---see last visit Objective:   Physical Exam  Constitutional: He is oriented to person, place, and time. He appears well-developed and well-nourished.  Musculoskeletal: Normal range of motion. He exhibits no tenderness.  Neurological: He is alert and oriented to person, place, and time.  Psychiatric: He has a normal mood and affect. His behavior is normal. Judgment and thought content normal.          Assessment & Plan:

## 2011-01-01 ENCOUNTER — Ambulatory Visit (INDEPENDENT_AMBULATORY_CARE_PROVIDER_SITE_OTHER)
Admission: RE | Admit: 2011-01-01 | Discharge: 2011-01-01 | Disposition: A | Discharge status: Home / Self Care | Payer: 59 | Source: Ambulatory Visit | Attending: Family Medicine | Admitting: Family Medicine

## 2011-01-01 DIAGNOSIS — R918 Other nonspecific abnormal finding of lung field: Secondary | ICD-10-CM

## 2011-01-01 DIAGNOSIS — R222 Localized swelling, mass and lump, trunk: Secondary | ICD-10-CM

## 2011-01-01 MED ORDER — IOHEXOL 300 MG/ML  SOLN
80.0000 mL | Freq: Once | INTRAMUSCULAR | Status: AC | PRN
  Administered 2011-01-01: 80 mL via INTRAVENOUS

## 2011-01-28 ENCOUNTER — Other Ambulatory Visit: Payer: Self-pay | Admitting: Family Medicine

## 2011-01-28 DIAGNOSIS — E785 Hyperlipidemia, unspecified: Secondary | ICD-10-CM

## 2011-01-28 DIAGNOSIS — R7401 Elevation of levels of liver transaminase levels: Secondary | ICD-10-CM

## 2011-01-28 DIAGNOSIS — R7402 Elevation of levels of lactic acid dehydrogenase (LDH): Secondary | ICD-10-CM

## 2011-01-29 ENCOUNTER — Other Ambulatory Visit: Payer: 59

## 2011-01-31 ENCOUNTER — Other Ambulatory Visit: Payer: Self-pay | Admitting: Family Medicine

## 2011-01-31 DIAGNOSIS — E785 Hyperlipidemia, unspecified: Secondary | ICD-10-CM

## 2011-02-01 ENCOUNTER — Other Ambulatory Visit: Payer: 59

## 2011-02-01 DIAGNOSIS — Z0289 Encounter for other administrative examinations: Secondary | ICD-10-CM

## 2011-02-04 NOTE — Progress Notes (Signed)
Labs only

## 2011-03-29 LAB — POCT I-STAT, CHEM 8
Chloride: 101 mEq/L (ref 96–112)
Creatinine, Ser: 1.1 mg/dL (ref 0.4–1.5)
Glucose, Bld: 132 mg/dL — ABNORMAL HIGH (ref 70–99)
HCT: 47 % (ref 39.0–52.0)
Potassium: 3.5 mEq/L (ref 3.5–5.1)

## 2011-05-23 ENCOUNTER — Ambulatory Visit (INDEPENDENT_AMBULATORY_CARE_PROVIDER_SITE_OTHER): Payer: 59 | Admitting: Internal Medicine

## 2011-05-23 ENCOUNTER — Encounter: Payer: Self-pay | Admitting: Internal Medicine

## 2011-05-23 VITALS — BP 130/96 | HR 88 | Temp 98.2°F | Resp 18 | Wt 228.1 lb

## 2011-05-23 DIAGNOSIS — J069 Acute upper respiratory infection, unspecified: Secondary | ICD-10-CM

## 2011-05-23 MED ORDER — DOXYCYCLINE HYCLATE 100 MG PO TABS: 100.0000 mg | ORAL_TABLET | Freq: Two times a day (BID) | ORAL | Status: AC

## 2011-05-23 NOTE — Progress Notes (Signed)
  Subjective:    Patient ID: Kevin Mueller, male    DOB: Dec 27, 1975, 35 y.o.   MRN: [id number]  HPI Pt presents to clinic for evaluation of URI sx's. Notes 6day h/o nasal congestion, drainage and NP cough. No f/c or hemoptysis. No alleviating or exacerbating factors. No other complaints.  Past Medical History  Diagnosis Date  . Hyperlipidemia   . IBS (irritable bowel syndrome)   . Elevated blood pressure (not hypertension)   . Hamartoma    Past Surgical History  Procedure Date  . Lung biopsy     FNA under xray guidance 1998 @ [facility]    reports that he has never smoked. He does not have any smokeless tobacco history on file. He reports that he drinks alcohol. He reports that he does not use illicit drugs. family history includes Cancer in an unspecified family member; Diabetes in an unspecified family member; Hyperlipidemia in an unspecified family member; and Kidney disease in an unspecified family member. Allergies  Allergen Reactions  . Codeine     Adverse reaction, per pt       Review of Systems see hpi     Objective:   Physical Exam  Nursing note and vitals reviewed. Constitutional: He appears well-developed and well-nourished. No distress.  HENT:  Head: Normocephalic and atraumatic.  Right Ear: Tympanic membrane, external ear and ear canal normal.  Left Ear: Tympanic membrane, external ear and ear canal normal.  Nose: Nose normal.  Mouth/Throat: Oropharynx is clear and moist. No oropharyngeal exudate.  Eyes: Conjunctivae are normal. Right eye exhibits no discharge. Left eye exhibits no discharge. No scleral icterus.  Neck: Neck supple.  Cardiovascular: Normal rate, regular rhythm and normal heart sounds.   Pulmonary/Chest: Effort normal and breath sounds normal.  Lymphadenopathy:    He has no cervical adenopathy.  Neurological: He is alert.  Skin: Skin is warm and dry. He is not diaphoretic.  Psychiatric: He has a normal mood and affect.            Assessment & Plan:

## 2011-05-24 DIAGNOSIS — J069 Acute upper respiratory infection, unspecified: Secondary | ICD-10-CM | POA: Insufficient documentation

## 2011-05-24 NOTE — Assessment & Plan Note (Signed)
abx printed to hold. Take abx to completion if sx's do not improve after total duration of 8-10 days. Followup if no improvement or worsening.

## 2011-07-28 ENCOUNTER — Other Ambulatory Visit: Payer: Self-pay | Admitting: Family Medicine

## 2011-08-05 ENCOUNTER — Ambulatory Visit (INDEPENDENT_AMBULATORY_CARE_PROVIDER_SITE_OTHER): Payer: BC Managed Care – PPO | Admitting: Internal Medicine

## 2011-08-05 ENCOUNTER — Telehealth: Payer: Self-pay | Admitting: Internal Medicine

## 2011-08-05 ENCOUNTER — Encounter: Payer: Self-pay | Admitting: Internal Medicine

## 2011-08-05 VITALS — BP 120/82 | HR 82 | Temp 97.9°F | Resp 18 | Ht 70.0 in | Wt 224.0 lb

## 2011-08-05 DIAGNOSIS — E785 Hyperlipidemia, unspecified: Secondary | ICD-10-CM

## 2011-08-05 DIAGNOSIS — J329 Chronic sinusitis, unspecified: Secondary | ICD-10-CM | POA: Insufficient documentation

## 2011-08-05 LAB — HEPATIC FUNCTION PANEL
AST: 37 U/L (ref 0–37)
Albumin: 4.7 g/dL (ref 3.5–5.2)
Total Bilirubin: 0.5 mg/dL (ref 0.3–1.2)
Total Protein: 7.7 g/dL (ref 6.0–8.3)

## 2011-08-05 LAB — LIPID PANEL
HDL: 51 mg/dL (ref >39)
Total CHOL/HDL Ratio: 3.4 Ratio
Triglycerides: 125 mg/dL (ref <150)

## 2011-08-05 MED ORDER — LEVOFLOXACIN 500 MG PO TABS: 500.0000 mg | ORAL_TABLET | Freq: Every day | ORAL | Status: AC

## 2011-08-05 NOTE — Telephone Encounter (Signed)
Lab order entered for June 2013. 

## 2011-08-05 NOTE — Patient Instructions (Signed)
Please schedule lipid/lft 272.4 prior to next visit  

## 2011-08-05 NOTE — Assessment & Plan Note (Signed)
Obtain lipid/lft. 

## 2011-08-05 NOTE — Assessment & Plan Note (Signed)
Attempt levaquin. Followup if no improvement or worsening.  

## 2011-08-05 NOTE — Progress Notes (Signed)
  Subjective:    Patient ID: Kevin Mueller, male    DOB: Nov 04, 1975, 36 y.o.   MRN: [id number]  HPI Pt presents to clinic for evaluation of possible sinusitis. Notes 3wk h/o green nasal drainage with paranasal sinus pressure. No f/c or chest congestion. Taking allegra and mucinex with only mild improvement. No other alleviating or exacerbating factors. H/o hyperlipidemia maintained on statin therapy. Past h/o elevated lfts. Last labs 5/12. No other complaints.  Past Medical History  Diagnosis Date  . Hyperlipidemia   . IBS (irritable bowel syndrome)   . Elevated blood pressure (not hypertension)   . Hamartoma    Past Surgical History  Procedure Date  . Lung biopsy     FNA under xray guidance 1998 @ [facility]    reports that he has never smoked. He has never used smokeless tobacco. He reports that he drinks alcohol. He reports that he does not use illicit drugs. family history includes Cancer in an unspecified family member; Diabetes in an unspecified family member; Hyperlipidemia in an unspecified family member; and Kidney disease in an unspecified family member. Allergies  Allergen Reactions  . Codeine     Adverse reaction, per pt     Review of Systems see hpi     Objective:   Physical Exam  Nursing note and vitals reviewed. Constitutional: He appears well-developed and well-nourished. No distress.  HENT:  Head: Normocephalic and atraumatic.  Right Ear: Tympanic membrane, external ear and ear canal normal.  Left Ear: Tympanic membrane, external ear and ear canal normal.  Nose: Nose normal.  Mouth/Throat: Oropharynx is clear and moist. No oropharyngeal exudate.  Eyes: Conjunctivae are normal. No scleral icterus.  Neck: Neck supple.  Neurological: He is alert.  Skin: Skin is warm and dry. He is not diaphoretic.  Psychiatric: He has a normal mood and affect.          Assessment & Plan:

## 2011-08-25 ENCOUNTER — Other Ambulatory Visit: Payer: Self-pay | Admitting: Family Medicine

## 2011-08-28 ENCOUNTER — Other Ambulatory Visit: Payer: Self-pay

## 2011-08-28 MED ORDER — ROSUVASTATIN CALCIUM 20 MG PO TABS: 20.0000 mg | ORAL_TABLET | Freq: Every day | ORAL | Status: DC

## 2011-09-22 IMAGING — CR DG PELVIS 1-2V
1 series · 1 of 1 positions shown · non-contrast
Comparison: None.

CLINICAL DATA: Iliac crest pain.  Chronic pain.

PELVIS - 1-2 VIEW

[view not recorded]
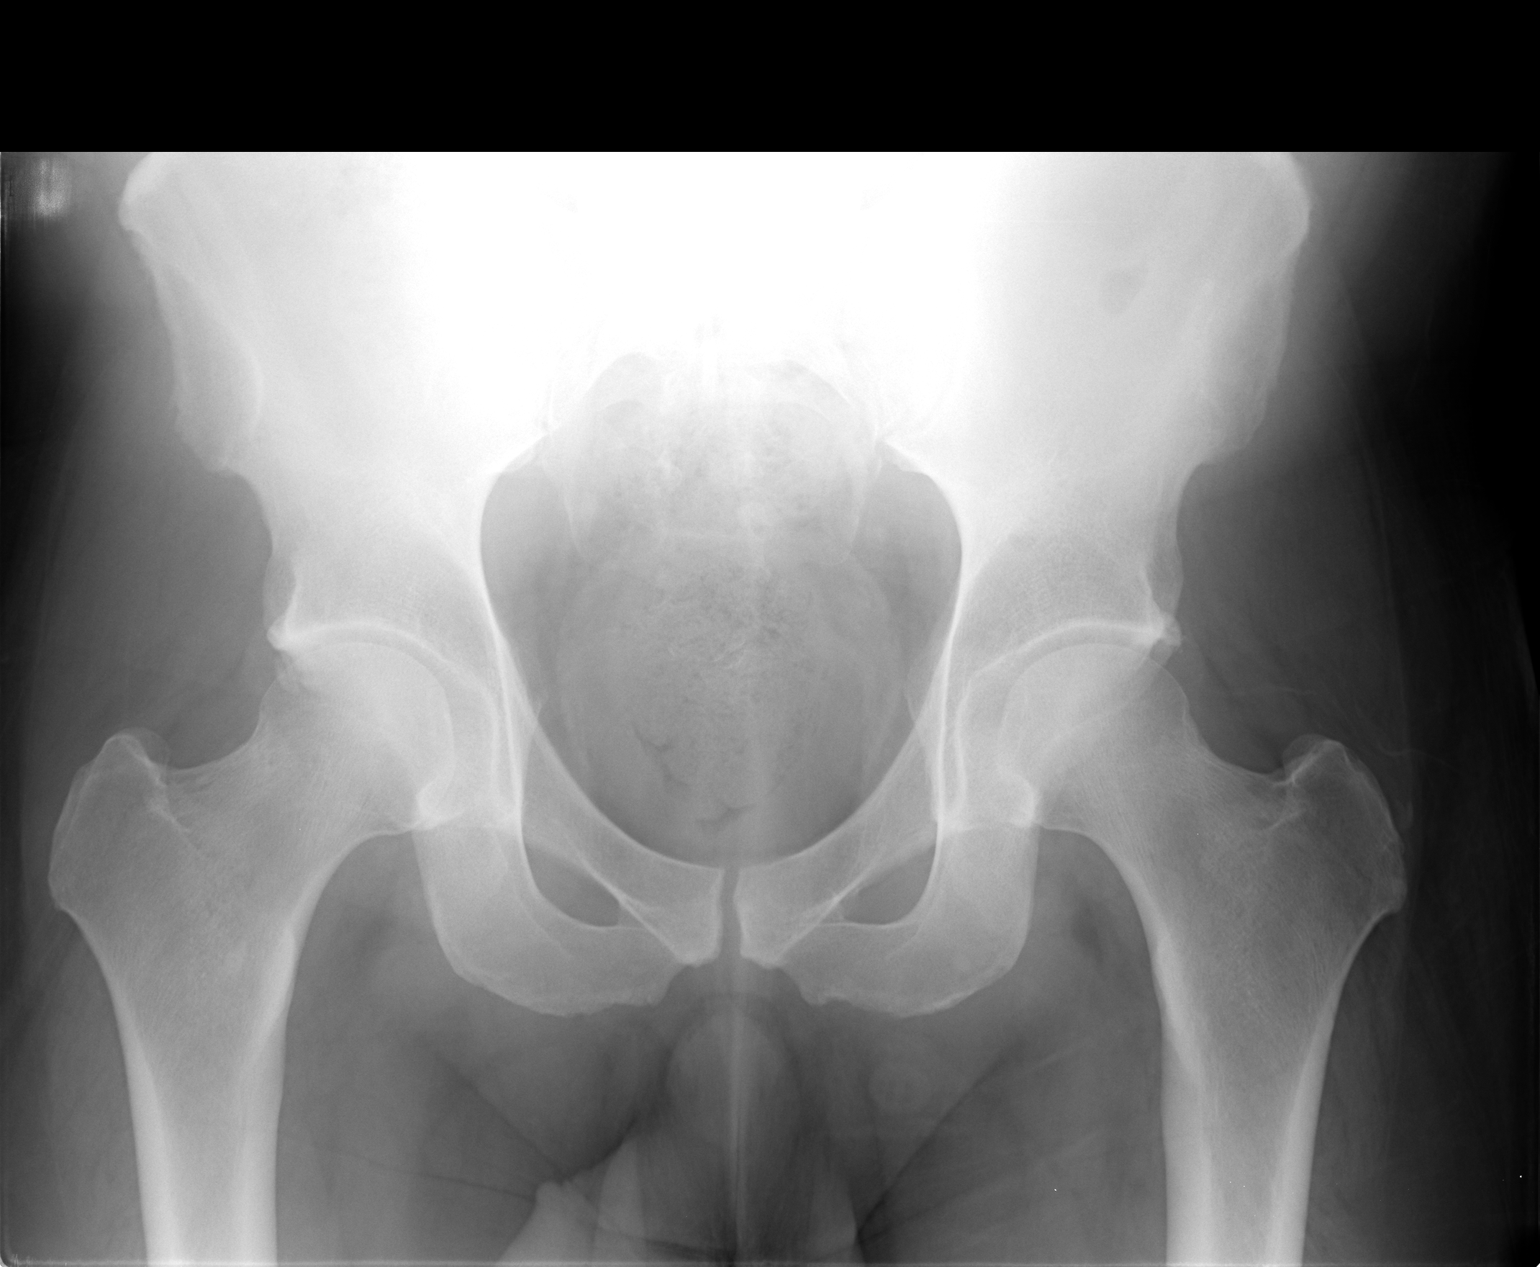

[1 of 1 positions shown; findings below may reference images not displayed]

FINDINGS: The pelvic rings are intact.  There is a small right os
acetabulum.  Pubic symphysis appears within normal limits.  SI
joints are unremarkable.  Sacral arcades normal.
IMPRESSION: No acute osseous abnormality.

## 2011-12-02 ENCOUNTER — Telehealth: Payer: Self-pay | Admitting: Internal Medicine

## 2011-12-02 ENCOUNTER — Ambulatory Visit (INDEPENDENT_AMBULATORY_CARE_PROVIDER_SITE_OTHER): Payer: BC Managed Care – PPO | Admitting: Internal Medicine

## 2011-12-02 ENCOUNTER — Encounter: Payer: Self-pay | Admitting: Internal Medicine

## 2011-12-02 VITALS — BP 122/80 | HR 62 | Temp 97.8°F | Resp 18 | Ht 70.0 in | Wt 229.0 lb

## 2011-12-02 DIAGNOSIS — F988 Other specified behavioral and emotional disorders with onset usually occurring in childhood and adolescence: Secondary | ICD-10-CM

## 2011-12-02 DIAGNOSIS — E785 Hyperlipidemia, unspecified: Secondary | ICD-10-CM

## 2011-12-02 LAB — HEPATIC FUNCTION PANEL
Albumin: 4.8 g/dL (ref 3.5–5.2)
Total Bilirubin: 0.7 mg/dL (ref 0.3–1.2)
Total Protein: 7.5 g/dL (ref 6.0–8.3)

## 2011-12-02 LAB — LIPID PANEL
Cholesterol: 156 mg/dL (ref 0–200)
LDL Cholesterol: 86 mg/dL (ref 0–99)
VLDL: 15 mg/dL (ref 0–40)

## 2011-12-02 NOTE — Progress Notes (Signed)
  Subjective:    Patient ID: Kevin Mueller, male    DOB: Dec 21, 1975, 36 y.o.   MRN: [id number]  HPI Pt presents to clinic for followup of multiple medical problems. Tolerating statin tx without myalgias. Has begun exercise program. Has good focus on adderall without agitation, anxiety or palpitations.   Past Medical History  Diagnosis Date  . Hyperlipidemia   . IBS (irritable bowel syndrome)   . Elevated blood pressure (not hypertension)   . Hamartoma    Past Surgical History  Procedure Date  . Lung biopsy     FNA under xray guidance 1998 @ [facility]    reports that he has never smoked. He has never used smokeless tobacco. He reports that he drinks alcohol. He reports that he does not use illicit drugs. family history includes Cancer in an unspecified family member; Diabetes in an unspecified family member; Hyperlipidemia in an unspecified family member; and Kidney disease in an unspecified family member. Allergies  Allergen Reactions  . Codeine     Adverse reaction, per pt      Review of Systems see hpi    Objective:   Physical Exam  Physical Exam  Nursing note and vitals reviewed. Constitutional: Appears well-developed and well-nourished. No distress.  HENT:  Head: Normocephalic and atraumatic.  Right Ear: External ear normal.  Left Ear: External ear normal.  Eyes: Conjunctivae are normal. No scleral icterus.  Neck: Neck supple. Carotid bruit is not present.  Cardiovascular: Normal rate, regular rhythm and normal heart sounds.  Exam reveals no gallop and no friction rub.   No murmur heard. Pulmonary/Chest: Effort normal and breath sounds normal. No respiratory distress. He has no wheezes. no rales.  Lymphadenopathy:    He has no cervical adenopathy.  Neurological:Alert.  Skin: Skin is warm and dry. Not diaphoretic.  Psychiatric: Has a normal mood and affect.        Assessment & Plan:

## 2011-12-02 NOTE — Patient Instructions (Signed)
Please schedule fasting labs prior to next visit Lipid/lft-272.4 

## 2011-12-02 NOTE — Telephone Encounter (Signed)
Lab order entered for December 2013. 

## 2011-12-02 NOTE — Assessment & Plan Note (Signed)
Stable. Obtain lipid/lft 

## 2011-12-02 NOTE — Assessment & Plan Note (Signed)
Stable. Continue current adderall dosing.

## 2012-05-25 NOTE — Addendum Note (Signed)
Addended by: Mervin Kung A on: 05/25/2012 09:15 AM   Modules accepted: Orders

## 2012-05-25 NOTE — Telephone Encounter (Signed)
Pt presented to the lab, future orders released. 

## 2012-05-26 LAB — HEPATIC FUNCTION PANEL
ALT: 111 U/L — ABNORMAL HIGH (ref 0–53)
AST: 93 U/L — ABNORMAL HIGH (ref 0–37)
Albumin: 4.8 g/dL (ref 3.5–5.2)
Alkaline Phosphatase: 59 U/L (ref 39–117)
Total Protein: 7.2 g/dL (ref 6.0–8.3)

## 2012-05-26 LAB — LIPID PANEL
HDL: 65 mg/dL (ref >39)
Triglycerides: 173 mg/dL — ABNORMAL HIGH (ref <150)

## 2012-06-01 ENCOUNTER — Ambulatory Visit (INDEPENDENT_AMBULATORY_CARE_PROVIDER_SITE_OTHER): Payer: BC Managed Care – PPO | Admitting: Internal Medicine

## 2012-06-01 ENCOUNTER — Telehealth: Payer: Self-pay | Admitting: Internal Medicine

## 2012-06-01 ENCOUNTER — Encounter: Payer: Self-pay | Admitting: Internal Medicine

## 2012-06-01 VITALS — BP 132/82 | HR 93 | Temp 97.8°F | Resp 12 | Wt 244.1 lb

## 2012-06-01 DIAGNOSIS — E785 Hyperlipidemia, unspecified: Secondary | ICD-10-CM

## 2012-06-01 DIAGNOSIS — R7402 Elevation of levels of lactic acid dehydrogenase (LDH): Secondary | ICD-10-CM

## 2012-06-01 DIAGNOSIS — R7989 Other specified abnormal findings of blood chemistry: Secondary | ICD-10-CM

## 2012-06-01 DIAGNOSIS — R945 Abnormal results of liver function studies: Secondary | ICD-10-CM

## 2012-06-01 DIAGNOSIS — R7401 Elevation of levels of liver transaminase levels: Secondary | ICD-10-CM

## 2012-06-01 NOTE — Telephone Encounter (Signed)
Needs a lab order for 07-13-2012 and an additional order for week of 11-23-2012          Kevin Mueller  Description:  36 year old male  06/01/2012 9:00 AM Office Visit Provider:  Edwyna Perfect, MD  MRN: [id number] Department:  [facility]             Reason for Visit     Follow-up    6 mth follow up; hyperlipidemia          Vitals - Last Recorded       BP  Pulse  Temp  Resp  Wt  SpO2    132/82  93  97.8 F (36.6 C) (Oral)  12  244 lb 1.3 oz (110.714 kg)  97%                 Not recorded                            Patient Instructions     Please return to lab in about 4-6 weeks for a non fasting LFT-abn lft  Also please schedule fasting labs lipid/lft-272.4 prior to next visit           Follow-up and Disposition     Return in about 4 months (around 09/30/2012).          All Flowsheet Templates (all recorded)     Encounter Vitals Flowsheet   Custom Formula Data Flowsheet   Anthropometrics Flowsheet                           Referring Provider          Edwyna Perfect, MD                     Other Encounter Related Information     Allergies & Medications      Problem List      History      Patient-Entered Questionnaires      Printed AVS Reports       Printed at    Printed by    06/01/2012 9:32 AM  After Visit Summary  Edwyna Perfect, MD           No data filed                              [name]  Description:  36 year old male  06/01/2012 9:00 AM Office Visit Provider:  Edwyna Perfect, MD  MRN: [id number] Department:  [facility]             Reason for Visit     Follow-up    6 mth follow up; hyperlipidemia          Vitals - Last Recorded       BP  Pulse  Temp  Resp  Wt  SpO2    132/82  93  97.8 F (36.6 C) (Oral)  12  244 lb 1.3 oz (110.714 kg)  97%                 Not recorded                             Patient Instructions     Please return to lab  in about 4-6 weeks for a non fasting LFT-abn lft  Also please schedule fasting labs lipid/lft-272.4 prior to next visit           Follow-up and Disposition     Return in about 4 months (around 09/30/2012).          All Flowsheet Templates (all recorded)     Encounter Vitals Flowsheet   Custom Formula Data Flowsheet   Anthropometrics Flowsheet                           Referring Provider          Edwyna Perfect, MD                     Other Encounter Related Information     Allergies & Medications      Problem List      History      Patient-Entered Questionnaires      Printed AVS Reports       Printed at    Printed by    06/01/2012 9:32 AM  After Visit Summary  Edwyna Perfect, MD           No data filed

## 2012-06-01 NOTE — Assessment & Plan Note (Signed)
H/o fatty liver on [place]. +weight gain. Recommend exercise and weight loss. Repeat lft 4-6 weeks.

## 2012-06-01 NOTE — Patient Instructions (Signed)
Please return to lab in about 4-6 weeks for a non fasting LFT-abn lft Also please schedule fasting labs lipid/lft-272.4 prior to next visit

## 2012-06-01 NOTE — Assessment & Plan Note (Signed)
Maintained on crestor but values increasing. +weight gain. Low fat diet, exercise and weight loss. Continue crestor dosing.

## 2012-06-01 NOTE — Progress Notes (Signed)
  Subjective:    Patient ID: DEGAN HANSER, male    DOB: 1975-12-25, 36 y.o.   MRN: [id number]  HPI Pt presents to clinic for followup of multiple medical problems. Reviewed increase in lft and chol results. H/o fatty liver and weight noted to be up 15lbs since last visit. Tolerating adderall without agitation, insomnia, tremor or palpitations. Currently followed and prescribed by psychiatry. States has received influenza vaccine for the season.  Past Medical History  Diagnosis Date  . Hyperlipidemia   . IBS (irritable bowel syndrome)   . Elevated blood pressure (not hypertension)   . Hamartoma    Past Surgical History  Procedure Date  . Lung biopsy     FNA under xray guidance 1998 @ [facility]    reports that he has quit smoking. His smoking use included Cigarettes. He started smoking about 5 years ago. He smoked 2 packs per day. He has never used smokeless tobacco. He reports that he drinks alcohol. He reports that he does not use illicit drugs. family history includes Cancer in an unspecified family member; Diabetes in an unspecified family member; Hyperlipidemia in an unspecified family member; and Kidney disease in an unspecified family member. Allergies  Allergen Reactions  . Codeine     Adverse reaction, per pt      Review of Systems see hpi     Objective:   Physical Exam  Nursing note and vitals reviewed. Constitutional: He appears well-developed and well-nourished. No distress.  HENT:  Head: Normocephalic and atraumatic.  Right Ear: External ear normal.  Left Ear: External ear normal.  Eyes: Conjunctivae normal are normal. No scleral icterus.  Neck: Neck supple. Carotid bruit is not present.  Cardiovascular: Normal rate, regular rhythm and normal heart sounds.  Exam reveals no gallop and no friction rub.   No murmur heard. Pulmonary/Chest: Effort normal and breath sounds normal. No respiratory distress. He has no wheezes. He has no rales.  Neurological: He is alert.   Skin: Skin is warm and dry. He is not diaphoretic.  Psychiatric: He has a normal mood and affect.          Assessment & Plan:

## 2012-07-14 NOTE — Telephone Encounter (Signed)
All future lab orders placed; first draw released to [facility] 01.21.14/SLS

## 2012-07-15 LAB — HEPATIC FUNCTION PANEL
Albumin: 4.8 g/dL (ref 3.5–5.2)
Total Protein: 7.5 g/dL (ref 6.0–8.3)

## 2012-07-15 NOTE — Telephone Encounter (Signed)
Quick Note:  Patient Informed and voiced understanding ______ 

## 2012-09-15 ENCOUNTER — Telehealth: Payer: Self-pay | Admitting: Internal Medicine

## 2012-09-15 MED ORDER — ROSUVASTATIN CALCIUM 20 MG PO TABS: 20.0000 mg | ORAL_TABLET | Freq: Every day | ORAL | Status: DC

## 2012-09-15 NOTE — Telephone Encounter (Signed)
Refill-crestor 20mg  tablets. Take one tablet by mouth daily. Qty 30 last fill 2.18.14

## 2012-09-22 ENCOUNTER — Ambulatory Visit (INDEPENDENT_AMBULATORY_CARE_PROVIDER_SITE_OTHER): Payer: BC Managed Care – PPO | Admitting: Family Medicine

## 2012-09-22 ENCOUNTER — Encounter: Payer: Self-pay | Admitting: Family Medicine

## 2012-09-22 VITALS — BP 122/90 | HR 79 | Temp 98.4°F | Ht 70.0 in | Wt 239.0 lb

## 2012-09-22 DIAGNOSIS — R030 Elevated blood-pressure reading, without diagnosis of hypertension: Secondary | ICD-10-CM

## 2012-09-22 DIAGNOSIS — J329 Chronic sinusitis, unspecified: Secondary | ICD-10-CM

## 2012-09-22 DIAGNOSIS — J309 Allergic rhinitis, unspecified: Secondary | ICD-10-CM

## 2012-09-22 DIAGNOSIS — G4700 Insomnia, unspecified: Secondary | ICD-10-CM

## 2012-09-22 DIAGNOSIS — F988 Other specified behavioral and emotional disorders with onset usually occurring in childhood and adolescence: Secondary | ICD-10-CM

## 2012-09-22 DIAGNOSIS — J019 Acute sinusitis, unspecified: Secondary | ICD-10-CM

## 2012-09-22 MED ORDER — DIPHENHYDRAMINE HCL 25 MG PO CAPS: 25.0000 mg | ORAL_CAPSULE | Freq: Every evening | ORAL | Status: DC | PRN

## 2012-09-22 MED ORDER — CLONIDINE HCL 0.1 MG PO TABS: 0.1000 mg | ORAL_TABLET | Freq: Every evening | ORAL | Status: DC | PRN

## 2012-09-22 MED ORDER — FLUTICASONE PROPIONATE 50 MCG/ACT NA SUSP: 2.0000 | Freq: Every day | NASAL | Status: DC

## 2012-09-22 MED ORDER — GUAIFENESIN ER 600 MG PO TB12: 600.0000 mg | ORAL_TABLET | Freq: Two times a day (BID) | ORAL | Status: DC

## 2012-09-22 MED ORDER — PROBIOTIC PO CAPS: ORAL_CAPSULE | ORAL | Status: DC

## 2012-09-22 MED ORDER — SULFAMETHOXAZOLE-TRIMETHOPRIM 800-160 MG PO TABS: 1.0000 | ORAL_TABLET | Freq: Two times a day (BID) | ORAL | Status: DC

## 2012-09-22 NOTE — Patient Instructions (Addendum)

## 2012-09-26 ENCOUNTER — Encounter: Payer: Self-pay | Admitting: Family Medicine

## 2012-09-26 DIAGNOSIS — G4700 Insomnia, unspecified: Secondary | ICD-10-CM

## 2012-09-26 HISTORY — DX: Insomnia, unspecified: G47.00

## 2012-09-26 NOTE — Assessment & Plan Note (Signed)
Try Clonodine qhs prn

## 2012-09-26 NOTE — Assessment & Plan Note (Signed)
Started on Bactrim DS, Mucinex, probiotics, increase rest and fluids.

## 2012-09-26 NOTE — Assessment & Plan Note (Signed)
Adequately controlled today. Minimize sodium

## 2012-09-26 NOTE — Assessment & Plan Note (Signed)
Using Adderal without side effects except for insomnia

## 2012-09-26 NOTE — Progress Notes (Signed)
Patient ID: Kevin Mueller, male   DOB: 02-Oct-1975, 37 y.o.   MRN: [id number] [facility] [id number] 1975/08/03 09/26/2012      Progress Note-Follow Up  Subjective  Chief Complaint  Chief Complaint  Patient presents with  . Sinusitis    sinus congestion, headache, cough w/phlegm green to yellow X 7 days    HPI  Patient is a 37 year old male who is in today complaining of roughly one week's worth of increasing sinus congestion and symptoms. He notes generalized malaise and myalgias. Is an ongoing headache and facial congestion. Has rhinorrhea and postnasal drip and now has begun to have a bad cough. Cough is productive green phlegm. He tried a course of Mucinex if symptoms do not improve. He's had some intermittent diarrhea but no nausea vomiting. No obvious high-grade fevers or chills. No complaints of ear pain, chest pain, palpitations or shortness of breath are noted.  Past Medical History  Diagnosis Date  . Hyperlipidemia   . IBS (irritable bowel syndrome)   . Elevated blood pressure (not hypertension)   . Hamartoma     Past Surgical History  Procedure Laterality Date  . Lung biopsy      FNA under xray guidance 1998 @ [facility]    Family History  Problem Relation Age of Onset  . Diabetes    . Hyperlipidemia    . Cancer      PGM ? digestive  . Kidney disease      History   Social History  . Marital Status: Married    Spouse Name: N/A    Number of Children: N/A  . Years of Education: N/A   Occupational History  . [employer]     Social History Main Topics  . Smoking status: Former Smoker -- 2.00 packs/day    Types: Cigarettes    Start date: 10/01/2006  . Smokeless tobacco: Never Used  . Alcohol Use: Yes  . Drug Use: No  . Sexually Active: Not on file   Other Topics Concern  . Not on file   Social History Narrative  . No narrative on file    Current Outpatient Prescriptions on File Prior to Visit  Medication Sig Dispense Refill  .  amphetamine-dextroamphetamine (ADDERALL) 20 MG tablet Take 20 mg by mouth 3 (three) times daily.        . Escitalopram Oxalate (LEXAPRO PO) Take 1 tablet by mouth daily.        . rosuvastatin (CRESTOR) 20 MG tablet Take 1 tablet (20 mg total) by mouth daily.  30 tablet  6   No current facility-administered medications on file prior to visit.    Allergies  Allergen Reactions  . Codeine     Adverse reaction, per pt  . Simvastatin     Review of Systems  Review of Systems  Constitutional: Positive for malaise/fatigue. Negative for fever and chills.  HENT: Positive for congestion.   Eyes: Negative for discharge.  Respiratory: Positive for cough and sputum production. Negative for shortness of breath.   Cardiovascular: Negative for chest pain, palpitations and leg swelling.  Gastrointestinal: Negative for nausea, abdominal pain and diarrhea.  Genitourinary: Negative for dysuria.  Musculoskeletal: Positive for myalgias. Negative for falls.  Skin: Negative for rash.  Neurological: Positive for headaches. Negative for loss of consciousness.  Endo/Heme/Allergies: Negative for polydipsia.  Psychiatric/Behavioral: Negative for depression and suicidal ideas. The patient is not nervous/anxious and does not have insomnia.     Objective  BP 122/90  Pulse 79  Temp(Src) 98.4 F (36.9 C) (Oral)  Ht 5\' 10"  (1.778 m)  Wt 239 lb 0.6 oz (108.428 kg)  BMI 34.3 kg/m2  SpO2 98%  Physical Exam  Physical Exam  Constitutional: He is oriented to person, place, and time and well-developed, well-nourished, and in no distress. No distress.  HENT:  Head: Normocephalic and atraumatic.  Nasal mucosa and posterior orpharynx boggy and erythematous  Eyes: Conjunctivae are normal.  Neck: Neck supple. No thyromegaly present.  Cardiovascular: Normal rate, regular rhythm and normal heart sounds.   No murmur heard. Pulmonary/Chest: Effort normal and breath sounds normal. No respiratory distress.   Abdominal: He exhibits no distension and no mass. There is no tenderness.  Musculoskeletal: He exhibits no edema.  Lymphadenopathy:    He has cervical adenopathy.  Neurological: He is alert and oriented to person, place, and time.  Skin: Skin is warm.  Psychiatric: Memory, affect and judgment normal.    Lab Results  Component Value Date   TSH 1.31 06/08/2010   Lab Results  Component Value Date   WBC 4.1* 11/20/2010   HGB 13.7 11/20/2010   HCT 39.7 11/20/2010   MCV 91.0 11/20/2010   PLT 249.0 11/20/2010   Lab Results  Component Value Date   CREATININE 0.9 06/08/2010   BUN 12 06/08/2010   NA 139 06/08/2010   K 4.5 06/08/2010   CL 102 06/08/2010   CO2 29 06/08/2010   Lab Results  Component Value Date   ALT 94* 07/14/2012   AST 60* 07/14/2012   ALKPHOS 55 07/14/2012   BILITOT 0.6 07/14/2012   Lab Results  Component Value Date   CHOL 216* 05/25/2012   Lab Results  Component Value Date   HDL 65 05/25/2012   Lab Results  Component Value Date   LDLCALC 116* 05/25/2012   Lab Results  Component Value Date   TRIG 173* 05/25/2012   Lab Results  Component Value Date   CHOLHDL 3.3 05/25/2012     Assessment & Plan  SINUSITIS, ACUTE Started on Bactrim DS, Mucinex, probiotics, increase rest and fluids.   ELEVATED BLOOD PRESSURE WITHOUT DIAGNOSIS OF HYPERTENSION Adequately controlled today. Minimize sodium  ADD (attention deficit disorder) Using Adderal without side effects except for insomnia  Insomnia Try Clonodine qhs prn

## 2012-10-16 ENCOUNTER — Ambulatory Visit (INDEPENDENT_AMBULATORY_CARE_PROVIDER_SITE_OTHER): Payer: BC Managed Care – PPO | Admitting: Family

## 2012-10-16 ENCOUNTER — Encounter: Payer: Self-pay | Admitting: Family

## 2012-10-16 ENCOUNTER — Ambulatory Visit (HOSPITAL_BASED_OUTPATIENT_CLINIC_OR_DEPARTMENT_OTHER)
Admission: RE | Admit: 2012-10-16 | Discharge: 2012-10-16 | Disposition: A | Discharge status: Home / Self Care | Payer: BC Managed Care – PPO | Source: Ambulatory Visit | Attending: Family | Admitting: Family

## 2012-10-16 ENCOUNTER — Other Ambulatory Visit (HOSPITAL_BASED_OUTPATIENT_CLINIC_OR_DEPARTMENT_OTHER): Payer: BC Managed Care – PPO

## 2012-10-16 ENCOUNTER — Telehealth: Payer: Self-pay | Admitting: Family

## 2012-10-16 VITALS — BP 127/86 | HR 86 | Temp 97.9°F | Resp 16 | Wt 237.1 lb

## 2012-10-16 DIAGNOSIS — X58XXXA Exposure to other specified factors, initial encounter: Secondary | ICD-10-CM | POA: Insufficient documentation

## 2012-10-16 DIAGNOSIS — M25569 Pain in unspecified knee: Secondary | ICD-10-CM

## 2012-10-16 DIAGNOSIS — M79609 Pain in unspecified limb: Secondary | ICD-10-CM | POA: Insufficient documentation

## 2012-10-16 DIAGNOSIS — T148XXA Other injury of unspecified body region, initial encounter: Secondary | ICD-10-CM

## 2012-10-16 DIAGNOSIS — M25562 Pain in left knee: Secondary | ICD-10-CM

## 2012-10-16 DIAGNOSIS — S7010XA Contusion of unspecified thigh, initial encounter: Secondary | ICD-10-CM | POA: Insufficient documentation

## 2012-10-16 LAB — CBC WITH DIFFERENTIAL/PLATELET
Basophils Relative: 1 % (ref 0–1)
Hemoglobin: 13.6 g/dL (ref 13.0–17.0)
Lymphocytes Relative: 27 % (ref 12–46)
Lymphs Abs: 1.3 10*3/uL (ref 0.7–4.0)
MCHC: 33.6 g/dL (ref 30.0–36.0)
Monocytes Relative: 7 % (ref 3–12)
Neutro Abs: 3 10*3/uL (ref 1.7–7.7)
Neutrophils Relative %: 64 % (ref 43–77)
RBC: 4.56 MIL/uL (ref 4.22–5.81)
WBC: 4.7 10*3/uL (ref 4.0–10.5)

## 2012-10-16 LAB — HEPATIC FUNCTION PANEL
AST: 29 U/L (ref 0–37)
Bilirubin, Direct: 0.2 mg/dL (ref 0.0–0.3)
Total Bilirubin: 1.1 mg/dL (ref 0.3–1.2)

## 2012-10-16 MED ORDER — MELOXICAM 7.5 MG PO TABS: 7.5000 mg | ORAL_TABLET | Freq: Every day | ORAL | Status: DC

## 2012-10-16 NOTE — Progress Notes (Signed)
Subjective:    Patient ID: Kevin Mueller, male    DOB: 1976/06/18, 37 y.o.   MRN: [id number]  HPI  Kevin Mueller is a 37 yr old male who presents today to discuss bruising of the left thigh. First noticed 2 weeks ago. Area is enlarging. Denies known injury. He does report that he had some muscle soreness and was using a massager to massage the area.      Review of Systems    see HPI  Past Medical History  Diagnosis Date  . Hyperlipidemia   . IBS (irritable bowel syndrome)   . Elevated blood pressure (not hypertension)   . Hamartoma   . Insomnia 09/26/2012    History   Social History  . Marital Status: Married    Spouse Name: N/A    Number of Children: N/A  . Years of Education: N/A   Occupational History  . [employer]     Social History Main Topics  . Smoking status: Former Smoker -- 2.00 packs/day    Types: Cigarettes    Start date: 10/01/2006  . Smokeless tobacco: Never Used  . Alcohol Use: Yes  . Drug Use: No  . Sexually Active: Not on file   Other Topics Concern  . Not on file   Social History Narrative  . No narrative on file    Past Surgical History  Procedure Laterality Date  . Lung biopsy      FNA under xray guidance 1998 @ [facility]    Family History  Problem Relation Age of Onset  . Diabetes    . Hyperlipidemia    . Cancer      PGM ? digestive  . Kidney disease      Allergies  Allergen Reactions  . Codeine     Adverse reaction, per pt  . Simvastatin     Current Outpatient Prescriptions on File Prior to Visit  Medication Sig Dispense Refill  . amphetamine-dextroamphetamine (ADDERALL) 20 MG tablet Take 20 mg by mouth 3 (three) times daily.        . Escitalopram Oxalate (LEXAPRO PO) Take 1 tablet by mouth daily.        . rosuvastatin (CRESTOR) 20 MG tablet Take 1 tablet (20 mg total) by mouth daily.  30 tablet  6  . cloNIDine (CATAPRES) 0.1 MG tablet Take 1 tablet (0.1 mg total) by mouth at bedtime as needed.  30  tablet  2  . diphenhydrAMINE (BENADRYL) 25 mg capsule Take 1 capsule (25 mg total) by mouth at bedtime as needed for itching, allergies or sleep.  30 capsule  0  . fluticasone (FLONASE) 50 MCG/ACT nasal spray Place 2 sprays into the nose daily.  16 g  6  . PROBIOTIC CAPS Digestive Advantage daily by Schiff       No current facility-administered medications on file prior to visit.    BP 127/86  Pulse 86  Temp(Src) 97.9 F (36.6 C) (Oral)  Resp 16  Wt 237 lb 1.3 oz (107.539 kg)  BMI 34.02 kg/m2  SpO2 98%    Objective:   Physical Exam  Constitutional: He is oriented to person, place, and time. He appears well-developed and well-nourished. No distress.  Cardiovascular: Normal rate and regular rhythm.   No murmur heard. Pulmonary/Chest: Effort normal and breath sounds normal. No respiratory distress. He has no wheezes. He has no rales. He exhibits no tenderness.  Neurological: He is alert and oriented to person, place, and time.  Skin: Skin  is warm and dry.  + echymosis left anterior thigh.  Striated pattern noted. Non tender  Psychiatric: He has a normal mood and affect. His behavior is normal. Judgment and thought content normal.          Assessment & Plan:

## 2012-10-16 NOTE — Patient Instructions (Addendum)
Please complete your lab work prior to leaving.  Follow up in 2 weeks.   

## 2012-10-16 NOTE — Telephone Encounter (Signed)
Reviewed ultrasound results,  Verbal order given to ultrasound tech ok to let pt go home and to let him know [place] neg for DVT.

## 2012-10-19 ENCOUNTER — Encounter: Payer: Self-pay | Admitting: Family

## 2012-10-20 DIAGNOSIS — Q859 Phakomatosis, unspecified: Secondary | ICD-10-CM | POA: Insufficient documentation

## 2012-10-20 HISTORY — DX: Phakomatosis, unspecified: Q85.9

## 2012-10-20 NOTE — Assessment & Plan Note (Signed)
Given pattern of striations/bruising, I suspect that this resulted from massaging the muscles.  CBC, LFT stable, LE doppler neg for DVT.

## 2012-10-30 ENCOUNTER — Ambulatory Visit: Payer: BC Managed Care – PPO | Admitting: Family

## 2012-11-03 ENCOUNTER — Other Ambulatory Visit: Payer: Self-pay | Admitting: Internal Medicine

## 2012-11-03 MED ORDER — MELOXICAM 7.5 MG PO TABS: 7.5000 mg | ORAL_TABLET | Freq: Every day | ORAL | Status: DC | PRN

## 2012-11-03 NOTE — Telephone Encounter (Signed)
Refill- meloxicam 7.5mg  tablets. Take one tablet by mouth daily. Qty 15 last fill 4.25.14

## 2012-11-03 NOTE — Telephone Encounter (Signed)
Please advise? Last RX wrote on 10-16-12 qty 15

## 2012-11-30 ENCOUNTER — Ambulatory Visit (INDEPENDENT_AMBULATORY_CARE_PROVIDER_SITE_OTHER): Payer: BC Managed Care – PPO | Admitting: Family Medicine

## 2012-11-30 ENCOUNTER — Ambulatory Visit: Payer: BC Managed Care – PPO | Admitting: Internal Medicine

## 2012-11-30 ENCOUNTER — Encounter: Payer: Self-pay | Admitting: Family Medicine

## 2012-11-30 VITALS — BP 128/90 | HR 68 | Temp 97.8°F | Ht 70.0 in | Wt 236.1 lb

## 2012-11-30 DIAGNOSIS — R030 Elevated blood-pressure reading, without diagnosis of hypertension: Secondary | ICD-10-CM

## 2012-11-30 DIAGNOSIS — K589 Irritable bowel syndrome without diarrhea: Secondary | ICD-10-CM

## 2012-11-30 DIAGNOSIS — Q859 Phakomatosis, unspecified: Secondary | ICD-10-CM

## 2012-11-30 DIAGNOSIS — IMO0001 Reserved for inherently not codable concepts without codable children: Secondary | ICD-10-CM

## 2012-11-30 DIAGNOSIS — R7401 Elevation of levels of liver transaminase levels: Secondary | ICD-10-CM

## 2012-11-30 DIAGNOSIS — F988 Other specified behavioral and emotional disorders with onset usually occurring in childhood and adolescence: Secondary | ICD-10-CM

## 2012-11-30 DIAGNOSIS — E785 Hyperlipidemia, unspecified: Secondary | ICD-10-CM

## 2012-11-30 DIAGNOSIS — F411 Generalized anxiety disorder: Secondary | ICD-10-CM

## 2012-11-30 LAB — RENAL FUNCTION PANEL
Albumin: 4.6 g/dL (ref 3.5–5.2)
CO2: 27 mEq/L (ref 19–32)
Glucose, Bld: 97 mg/dL (ref 70–99)
Potassium: 4.9 mEq/L (ref 3.5–5.3)
Sodium: 138 mEq/L (ref 135–145)

## 2012-11-30 LAB — CBC
HCT: 40.8 % (ref 39.0–52.0)
MCH: 29.7 pg (ref 26.0–34.0)
MCHC: 34.1 g/dL (ref 30.0–36.0)
MCV: 87.2 fL (ref 78.0–100.0)
RDW: 14 % (ref 11.5–15.5)

## 2012-11-30 LAB — HEPATIC FUNCTION PANEL
ALT: 35 U/L (ref 0–53)
AST: 32 U/L (ref 0–37)
Bilirubin, Direct: 0.1 mg/dL (ref 0.0–0.3)
Indirect Bilirubin: 0.4 mg/dL (ref 0.0–0.9)

## 2012-11-30 LAB — LIPID PANEL: LDL Cholesterol: 63 mg/dL (ref 0–99)

## 2012-11-30 NOTE — Progress Notes (Signed)
Patient ID: Kevin Mueller, male   DOB: 1975-07-23, 37 y.o.   MRN: [id number] Kevin Mueller [id number] [birth year] 11/30/2012      Progress Note-Follow Up  Subjective  Chief Complaint  Chief Complaint  Patient presents with  . Follow-up    6 month    HPI  Patient is a 37 year old Caucasian male who is in today for followup. He is generally feeling well but is struggling with some pain. Notes recent increase in right elbow pain, left hip pain and some knee pain. His following with [facility] or so at this time. They're treating his elbow his tennis elbow. With codeine and topical treatments he is improving. He continues to take his Adderall once to 3 times daily when he needs it and that helps him focus at work. He is denying any recent illness. No chest pain or palpitations. No shortness of breath, headaches, GU complaints. Does struggle with low-grade IBS with some loose stool intermittently.  Past Medical History  Diagnosis Date  . Hyperlipidemia   . IBS (irritable bowel syndrome)   . Elevated blood pressure (not hypertension)   . Hamartoma   . Insomnia 09/26/2012    Past Surgical History  Procedure Laterality Date  . Lung biopsy      FNA under xray guidance 1998 @ [facility]    Family History  Problem Relation Age of Onset  . Diabetes    . Hyperlipidemia    . Cancer      PGM ? digestive  . Kidney disease      History   Social History  . Marital Status: Married    Spouse Name: N/A    Number of Children: N/A  . Years of Education: N/A   Occupational History  . [employer]     Social History Main Topics  . Smoking status: Former Smoker -- 2.00 packs/day    Types: Cigarettes    Start date: 10/01/2006  . Smokeless tobacco: Never Used  . Alcohol Use: Yes  . Drug Use: No  . Sexually Active: Not on file   Other Topics Concern  . Not on file   Social History Narrative  . No narrative on file    Current Outpatient Prescriptions on File Prior to  Visit  Medication Sig Dispense Refill  . amphetamine-dextroamphetamine (ADDERALL) 20 MG tablet Take 20 mg by mouth 3 (three) times daily.        . cloNIDine (CATAPRES) 0.1 MG tablet Take 1 tablet (0.1 mg total) by mouth at bedtime as needed.  30 tablet  2  . diphenhydrAMINE (BENADRYL) 25 mg capsule Take 1 capsule (25 mg total) by mouth at bedtime as needed for itching, allergies or sleep.  30 capsule  0  . fluticasone (FLONASE) 50 MCG/ACT nasal spray Place 2 sprays into the nose daily.  16 g  6  . meloxicam (MOBIC) 7.5 MG tablet Take 1 tablet (7.5 mg total) by mouth daily as needed for pain. With food  15 tablet  2  . PROBIOTIC CAPS Digestive Advantage daily by Schiff      . rosuvastatin (CRESTOR) 20 MG tablet Take 1 tablet (20 mg total) by mouth daily.  30 tablet  6   No current facility-administered medications on file prior to visit.    Allergies  Allergen Reactions  . Codeine     Adverse reaction, per pt  . Simvastatin     Review of Systems  Review of Systems  Constitutional: Negative for fever and  malaise/fatigue.  HENT: Negative for congestion.   Eyes: Negative for discharge.  Respiratory: Negative for shortness of breath.   Cardiovascular: Negative for chest pain, palpitations and leg swelling.  Gastrointestinal: Negative for nausea, abdominal pain and diarrhea.  Genitourinary: Negative for dysuria.  Musculoskeletal: Positive for joint pain. Negative for falls.       Left knee pain  Skin: Negative for rash.  Neurological: Negative for loss of consciousness and headaches.  Endo/Heme/Allergies: Negative for polydipsia.  Psychiatric/Behavioral: Negative for depression and suicidal ideas. The patient is not nervous/anxious and does not have insomnia.     Objective  BP 128/90  Pulse 68  Temp(Src) 97.8 F (36.6 C) (Oral)  Ht 5\' 10"  (1.778 m)  Wt 236 lb 1.9 oz (107.103 kg)  BMI 33.88 kg/m2  SpO2 97%  Physical Exam  Physical Exam  Constitutional: He is oriented to  person, place, and time and well-developed, well-nourished, and in no distress. No distress.  HENT:  Head: Normocephalic and atraumatic.  Eyes: Conjunctivae are normal.  Neck: Neck supple. No thyromegaly present.  Cardiovascular: Normal rate, regular rhythm and normal heart sounds.   No murmur heard. Pulmonary/Chest: Effort normal and breath sounds normal. No respiratory distress.  Abdominal: He exhibits no distension and no mass. There is no tenderness.  Musculoskeletal: He exhibits no edema.  Neurological: He is alert and oriented to person, place, and time.  Skin: Skin is warm.  Psychiatric: Memory, affect and judgment normal.    Lab Results  Component Value Date   TSH 1.31 06/08/2010   Lab Results  Component Value Date   WBC 4.7 10/16/2012   HGB 13.6 10/16/2012   HCT 40.5 10/16/2012   MCV 88.8 10/16/2012   PLT 270 10/16/2012   Lab Results  Component Value Date   CREATININE 0.9 06/08/2010   BUN 12 06/08/2010   NA 139 06/08/2010   K 4.5 06/08/2010   CL 102 06/08/2010   CO2 29 06/08/2010   Lab Results  Component Value Date   ALT 35 10/16/2012   AST 29 10/16/2012   ALKPHOS 59 10/16/2012   BILITOT 1.1 10/16/2012   Lab Results  Component Value Date   CHOL 216* 05/25/2012   Lab Results  Component Value Date   HDL 65 05/25/2012   Lab Results  Component Value Date   LDLCALC 116* 05/25/2012   Lab Results  Component Value Date   TRIG 173* 05/25/2012   Lab Results  Component Value Date   CHOLHDL 3.3 05/25/2012     Assessment & Plan  ELEVATED BLOOD PRESSURE WITHOUT DIAGNOSIS OF HYPERTENSION Well controlled no concerns today, encouraged DASH diet  ADD (attention deficit disorder) Follows with psychiatry for his meds  TRANSAMINASES, SERUM, ELEVATED improved  HYPERLIPIDEMIA Continue Crestor and add Niacin ER 500 mg qhs with lowfat snack and 81 mg ECASA 1/2 hour prior. Minimize simple carbs, add krill oil  IBS Encouraged probiotics and fiber  OTHER CONGENITAL  HAMARTOSES NEC Asymptomatic and stable since teens, may need repeat imaging in future

## 2012-11-30 NOTE — Patient Instructions (Addendum)
Consider krill oil/MegaRed caps daily Probiotic such as Digestive Advantage Consider a fiber supplement such as Benefiber/Metamucil  Next visit annual lipid, renal, hepatic, tsh, cbc    Irritable Bowel Syndrome Irritable Bowel Syndrome (IBS) is caused by a disturbance of normal bowel function. Other terms used are spastic colon, mucous colitis, and irritable colon. It does not require surgery, nor does it lead to cancer. There is no cure for IBS. But with proper diet, stress reduction, and medication, you will find that your problems (symptoms) will gradually disappear or improve. IBS is a common digestive disorder. It usually appears in late adolescence or early adulthood. Women develop it twice as often as men. CAUSES  After food has been digested and absorbed in the small intestine, waste material is moved into the colon (large intestine). In the colon, water and salts are absorbed from the undigested products coming from the small intestine. The remaining residue, or fecal material, is held for elimination. Under normal circumstances, gentle, rhythmic contractions on the bowel walls push the fecal material along the colon towards the rectum. In IBS, however, these contractions are irregular and poorly coordinated. The fecal material is either retained too long, resulting in constipation, or expelled too soon, producing diarrhea. SYMPTOMS  The most common symptom of IBS is pain. It is typically in the lower left side of the belly (abdomen). But it may occur anywhere in the abdomen. It can be felt as heartburn, backache, or even as a dull pain in the arms or shoulders. The pain comes from excessive bowel-muscle spasms and from the buildup of gas and fecal material in the colon. This pain:  Can range from sharp belly (abdominal) cramps to a dull, continuous ache.  Usually worsens soon after eating.  Is typically relieved by having a bowel movement or passing gas. Abdominal pain is usually  accompanied by constipation. But it may also produce diarrhea. The diarrhea typically occurs right after a meal or upon arising in the morning. The stools are typically soft and watery. They are often flecked with secretions (mucus). Other symptoms of IBS include:  Bloating.  Loss of appetite.  Heartburn.  Feeling sick to your stomach (nausea).  Belching  Vomiting  Gas. IBS may also cause a number of symptoms that are unrelated to the digestive system:  Fatigue.  Headaches.  Anxiety  Shortness of breath  Difficulty in concentrating.  Dizziness. These symptoms tend to come and go. DIAGNOSIS  The symptoms of IBS closely mimic the symptoms of other, more serious digestive disorders. So your caregiver may wish to perform a variety of additional tests to exclude these disorders. He/she wants to be certain of learning what is wrong (diagnosis). The nature and purpose of each test will be explained to you. TREATMENT A number of medications are available to help correct bowel function and/or relieve bowel spasms and abdominal pain. Among the drugs available are:  Mild, non-irritating laxatives for severe constipation and to help restore normal bowel habits.  Specific anti-diarrheal medications to treat severe or prolonged diarrhea.  Anti-spasmodic agents to relieve intestinal cramps.  Your caregiver may also decide to treat you with a mild tranquilizer or sedative during unusually stressful periods in your life. The important thing to remember is that if any drug is prescribed for you, make sure that you take it exactly as directed. Make sure that your caregiver knows how well it worked for you. HOME CARE INSTRUCTIONS   Avoid foods that are high in fat or oils. Some  examples GNF:AOZHY cream, butter, frankfurters, sausage, and other fatty meats.  Avoid foods that have a laxative effect, such as fruit, fruit juice, and dairy products.  Cut out carbonated drinks, chewing gum,  and "gassy" foods, such as beans and cabbage. This may help relieve bloating and belching.  Bran taken with plenty of liquids may help relieve constipation.  Keep track of what foods seem to trigger your symptoms.  Avoid emotionally charged situations or circumstances that produce anxiety.  Start or continue exercising.  Get plenty of rest and sleep. MAKE SURE YOU:   Understand these instructions.  Will watch your condition.  Will get help right away if you are not doing well or get worse. Document Released: 06/10/2005 Document Revised: 09/02/2011 Document Reviewed: 01/29/2008 [facility] Patient Information 2014 [place], [place].

## 2012-12-01 ENCOUNTER — Telehealth: Payer: Self-pay

## 2012-12-01 NOTE — Telephone Encounter (Signed)
Message copied by Court Joy on Tue Dec 01, 2012 11:02 AM ------      Message from: Danise Edge A      Created: Tue Dec 01, 2012  9:17 AM       This guy is on Crestor already, did I send you a note to add Niacin on him, I feel like I meant to and maybe did not for some reason. I would like him to start Niacin ER 500 mg 1 tab po qhs with lowfat snack and 81 mg ECASA 1/2 hour prior. Warn about flushing and that it usually gets better ------

## 2012-12-01 NOTE — Assessment & Plan Note (Signed)
improved

## 2012-12-01 NOTE — Telephone Encounter (Signed)
See note copied

## 2012-12-01 NOTE — Assessment & Plan Note (Signed)
Asymptomatic and stable since teens, may need repeat imaging in future

## 2012-12-01 NOTE — Assessment & Plan Note (Signed)
Well controlled no concerns today, encouraged DASH diet

## 2012-12-01 NOTE — Assessment & Plan Note (Signed)
Follows with psychiatry for his meds

## 2012-12-01 NOTE — Assessment & Plan Note (Signed)
Encouraged probiotics and fiber. 

## 2012-12-01 NOTE — Assessment & Plan Note (Addendum)
Continue Crestor and add Niacin ER 500 mg qhs with lowfat snack and 81 mg ECASA 1/2 hour prior. Minimize simple carbs, add krill oil

## 2012-12-02 NOTE — Telephone Encounter (Signed)
Left a vm for pt to return my call.

## 2012-12-07 ENCOUNTER — Encounter: Payer: Self-pay | Admitting: Internal Medicine

## 2012-12-07 NOTE — Telephone Encounter (Signed)
Mailed pts lab results to him and wrote at the top for pt to return our call.

## 2013-04-28 ENCOUNTER — Other Ambulatory Visit: Payer: Self-pay | Admitting: Family Medicine

## 2013-05-21 ENCOUNTER — Encounter: Payer: Self-pay | Admitting: Internal Medicine

## 2013-06-28 ENCOUNTER — Encounter: Payer: BC Managed Care – PPO | Admitting: Family Medicine

## 2013-06-28 ENCOUNTER — Ambulatory Visit: Payer: BC Managed Care – PPO | Admitting: Family Medicine

## 2013-07-05 ENCOUNTER — Encounter: Payer: Self-pay | Admitting: Physician Assistant

## 2013-07-05 ENCOUNTER — Ambulatory Visit (INDEPENDENT_AMBULATORY_CARE_PROVIDER_SITE_OTHER): Payer: Self-pay | Admitting: Family Medicine

## 2013-07-05 VITALS — BP 124/86 | HR 103 | Temp 99.5°F | Resp 16 | Ht 70.0 in | Wt 243.2 lb

## 2013-07-05 DIAGNOSIS — E785 Hyperlipidemia, unspecified: Secondary | ICD-10-CM

## 2013-07-05 DIAGNOSIS — J329 Chronic sinusitis, unspecified: Secondary | ICD-10-CM

## 2013-07-05 MED ORDER — AMOXICILLIN-POT CLAVULANATE 875-125 MG PO TABS
1.0000 | ORAL_TABLET | Freq: Two times a day (BID) | ORAL | Status: DC
Start: 2013-07-05 — End: 2013-07-21

## 2013-07-05 MED ORDER — CEFDINIR 300 MG PO CAPS: 300.0000 mg | ORAL_CAPSULE | Freq: Two times a day (BID) | ORAL | Status: DC

## 2013-07-05 NOTE — Patient Instructions (Signed)
Mucinex 600 mg po twice daily x 10 day Probiotic such as Digestive Advantage Zinc such as Coldeeze or ca/mag/zinc daily    Sinusitis Sinusitis is redness, soreness, and swelling (inflammation) of the paranasal sinuses. Paranasal sinuses are air pockets within the bones of your face (beneath the eyes, the middle of the forehead, or above the eyes). In healthy paranasal sinuses, mucus is able to drain out, and air is able to circulate through them by way of your nose. However, when your paranasal sinuses are inflamed, mucus and air can become trapped. This can allow bacteria and other germs to grow and cause infection. Sinusitis can develop quickly and last only a short time (acute) or continue over a long period (chronic). Sinusitis that lasts for more than 12 weeks is considered chronic.  CAUSES  Causes of sinusitis include:  Allergies.  Structural abnormalities, such as displacement of the cartilage that separates your nostrils (deviated septum), which can decrease the air flow through your nose and sinuses and affect sinus drainage.  Functional abnormalities, such as when the small hairs (cilia) that line your sinuses and help remove mucus do not work properly or are not present. SYMPTOMS  Symptoms of acute and chronic sinusitis are the same. The primary symptoms are pain and pressure around the affected sinuses. Other symptoms include:  Upper toothache.  Earache.  Headache.  Bad breath.  Decreased sense of smell and taste.  A cough, which worsens when you are lying flat.  Fatigue.  Fever.  Thick drainage from your nose, which often is green and may contain pus (purulent).  Swelling and warmth over the affected sinuses. DIAGNOSIS  Your caregiver will perform a physical exam. During the exam, your caregiver may:  Look in your nose for signs of abnormal growths in your nostrils (nasal polyps).  Tap over the affected sinus to check for signs of infection.  View the inside  of your sinuses (endoscopy) with a special imaging device with a light attached (endoscope), which is inserted into your sinuses. If your caregiver suspects that you have chronic sinusitis, one or more of the following tests may be recommended:  Allergy tests.  Nasal culture A sample of mucus is taken from your nose and sent to a lab and screened for bacteria.  Nasal cytology A sample of mucus is taken from your nose and examined by your caregiver to determine if your sinusitis is related to an allergy. TREATMENT  Most cases of acute sinusitis are related to a viral infection and will resolve on their own within 10 days. Sometimes medicines are prescribed to help relieve symptoms (pain medicine, decongestants, nasal steroid sprays, or saline sprays).  However, for sinusitis related to a bacterial infection, your caregiver will prescribe antibiotic medicines. These are medicines that will help kill the bacteria causing the infection.  Rarely, sinusitis is caused by a fungal infection. In theses cases, your caregiver will prescribe antifungal medicine. For some cases of chronic sinusitis, surgery is needed. Generally, these are cases in which sinusitis recurs more than 3 times per year, despite other treatments. HOME CARE INSTRUCTIONS   Drink plenty of water. Water helps thin the mucus so your sinuses can drain more easily.  Use a humidifier.  Inhale steam 3 to 4 times a day (for example, sit in the bathroom with the shower running).  Apply a warm, moist washcloth to your face 3 to 4 times a day, or as directed by your caregiver.  Use saline nasal sprays to help moisten  and clean your sinuses.  Take over-the-counter or prescription medicines for pain, discomfort, or fever only as directed by your caregiver. SEEK IMMEDIATE MEDICAL CARE IF:  You have increasing pain or severe headaches.  You have nausea, vomiting, or drowsiness.  You have swelling around your face.  You have vision  problems.  You have a stiff neck.  You have difficulty breathing. MAKE SURE YOU:   Understand these instructions.  Will watch your condition.  Will get help right away if you are not doing well or get worse. Document Released: 06/10/2005 Document Revised: 09/02/2011 Document Reviewed: 06/25/2011 [facility] Patient Information 2014 [place], [place].

## 2013-07-05 NOTE — Progress Notes (Signed)
Pre visit review using our clinic review tool, if applicable. No additional management support is needed unless otherwise documented below in the visit note/SLS  

## 2013-07-09 ENCOUNTER — Telehealth: Payer: Self-pay

## 2013-07-09 NOTE — Telephone Encounter (Signed)
Pt left a message stating that he started the Cefdinir on Monday and feels 4X worse. Pt would like the Augmentin sent to the pharmacy. LMOVM to return my call.  This antibiotic has already been sent by md

## 2013-07-10 ENCOUNTER — Encounter: Payer: Self-pay | Admitting: Family Medicine

## 2013-07-10 DIAGNOSIS — J329 Chronic sinusitis, unspecified: Secondary | ICD-10-CM | POA: Insufficient documentation

## 2013-07-10 NOTE — Progress Notes (Signed)
Patient ID: Kevin Mueller, male   DOB: 08-13-1975, 38 y.o.   MRN: [id number] [name] [id number] [birth year] 07/10/2013      Progress Note-Follow Up  Subjective  Chief Complaint  Chief Complaint  Patient presents with  . Nasal Congestion    Pt c/o head & chest congestion, sinus pain & pressure, post nasal drainage w/sore throat, cough w/yellow to brown phlegm x3 wks    HPI  Patient is a 38 yo male who is in today for evaluation of respiratory symptoms. Has a child and wife at home who have been sick as well. Has head and chest congetion. Notes fevers, chills, malaise, myalgias, PND, sore throat, cough productive of yellow brown phlegm. No c/o cp/palp/sob/gi or gu c/o  Past Medical History  Diagnosis Date  . Hyperlipidemia   . IBS (irritable bowel syndrome)   . Elevated blood pressure (not hypertension)   . Hamartoma   . Insomnia 09/26/2012    Past Surgical History  Procedure Laterality Date  . Lung biopsy      FNA under xray guidance 1998 @ [facility]    Family History  Problem Relation Age of Onset  . Diabetes    . Hyperlipidemia    . Cancer      PGM ? digestive  . Kidney disease      History   Social History  . Marital Status: Married    Spouse Name: N/A    Number of Children: N/A  . Years of Education: N/A   Occupational History  . [employer]     Social History Main Topics  . Smoking status: Former Smoker -- 2.00 packs/day    Types: Cigarettes    Start date: 10/01/2006  . Smokeless tobacco: Never Used  . Alcohol Use: Yes  . Drug Use: No  . Sexual Activity: Not on file   Other Topics Concern  . Not on file   Social History Narrative  . No narrative on file    Current Outpatient Prescriptions on File Prior to Visit  Medication Sig Dispense Refill  . amphetamine-dextroamphetamine (ADDERALL) 20 MG tablet Take 20 mg by mouth 3 (three) times daily.        . diphenhydrAMINE (BENADRYL) 25 mg capsule Take 1 capsule (25 mg total) by  mouth at bedtime as needed for itching, allergies or sleep.  30 capsule  0  . escitalopram (LEXAPRO) 20 MG tablet Take 1 tablet (20 mg total) by mouth daily.      . rosuvastatin (CRESTOR) 20 MG tablet Take 1 tablet (20 mg total) by mouth daily.  30 tablet  5   No current facility-administered medications on file prior to visit.    Allergies  Allergen Reactions  . Codeine     Adverse reaction, per pt  . Simvastatin     Review of Systems  Review of Systems  Constitutional: Positive for fever and chills. Negative for malaise/fatigue.  HENT: Positive for congestion.   Eyes: Negative for discharge.  Respiratory: Positive for cough and sputum production. Negative for shortness of breath.   Cardiovascular: Negative for chest pain, palpitations and leg swelling.  Gastrointestinal: Negative for nausea, abdominal pain and diarrhea.  Genitourinary: Negative for dysuria.  Musculoskeletal: Negative for falls.  Skin: Negative for rash.  Neurological: Positive for headaches. Negative for loss of consciousness.  Endo/Heme/Allergies: Negative for polydipsia.  Psychiatric/Behavioral: Negative for depression and suicidal ideas. The patient is not nervous/anxious and does not have insomnia.     Objective  BP 124/86  Pulse 103  Temp(Src) 99.5 F (37.5 C) (Oral)  Resp 16  Ht 5\' 10"  (1.778 m)  Wt 243 lb 4 oz (110.337 kg)  BMI 34.90 kg/m2  SpO2 98%  Physical Exam  Physical Exam  Constitutional: He is oriented to person, place, and time and well-developed, well-nourished, and in no distress. No distress.  HENT:  Head: Normocephalic and atraumatic.  Eyes: Conjunctivae are normal.  Neck: Neck supple. No thyromegaly present.  Cardiovascular: Normal rate, regular rhythm and normal heart sounds.   No murmur heard. Pulmonary/Chest: Effort normal and breath sounds normal. No respiratory distress.  Abdominal: He exhibits no distension and no mass. There is no tenderness.  Musculoskeletal: He  exhibits no edema.  Neurological: He is alert and oriented to person, place, and time.  Skin: Skin is warm.  Psychiatric: Memory, affect and judgment normal.    Lab Results  Component Value Date   TSH 1.428 11/30/2012   Lab Results  Component Value Date   WBC 4.1 11/30/2012   HGB 13.9 11/30/2012   HCT 40.8 11/30/2012   MCV 87.2 11/30/2012   PLT 227 11/30/2012   Lab Results  Component Value Date   CREATININE 0.90 11/30/2012   BUN 15 11/30/2012   NA 138 11/30/2012   K 4.9 11/30/2012   CL 103 11/30/2012   CO2 27 11/30/2012   Lab Results  Component Value Date   ALT 35 11/30/2012   AST 32 11/30/2012   ALKPHOS 60 11/30/2012   BILITOT 0.5 11/30/2012   Lab Results  Component Value Date   CHOL 162 11/30/2012   Lab Results  Component Value Date   HDL 46 11/30/2012   Lab Results  Component Value Date   LDLCALC 63 11/30/2012   Lab Results  Component Value Date   TRIG 263* 11/30/2012   Lab Results  Component Value Date   CHOLHDL 3.5 11/30/2012     Assessment & Plan  Sinusitis Started on Augmentin, Mucinex, probiotics.   HYPERLIPIDEMIA Given samples of Crestor today

## 2013-07-10 NOTE — Assessment & Plan Note (Signed)
Given samples of Crestor today

## 2013-07-10 NOTE — Assessment & Plan Note (Signed)
Started on Augmentin, Mucinex, probiotics.

## 2013-07-12 ENCOUNTER — Telehealth: Payer: Self-pay

## 2013-07-12 DIAGNOSIS — J309 Allergic rhinitis, unspecified: Secondary | ICD-10-CM

## 2013-07-12 MED ORDER — FLUTICASONE PROPIONATE 50 MCG/ACT NA SUSP: 2.0000 | Freq: Every day | NASAL | Status: DC

## 2013-07-12 NOTE — Telephone Encounter (Signed)
Left message on vm

## 2013-07-12 NOTE — Telephone Encounter (Signed)
Patient informed and voiced understanding

## 2013-07-12 NOTE — Telephone Encounter (Signed)
Rx for Flonase sent to patient's pharmacy.  I would also advise patient to place a humidifier in his bedroom.

## 2013-07-12 NOTE — Telephone Encounter (Signed)
Pt informed per Einar Pheasant that he needs to complete the Cefdinir RX and if he's not feeling any better to give [place] a call.  Pt also asked if there was anything else he could do. I asked pt if he was doing the things that MD suggested on AVS? Pt stated that MD didn't put anything else on his AVS. I tried to explain to pt that it is at the top of his paperwork and it states to take mucinex 600 mg bid X 10 days, a probiotic and coldeeze (or something with zinc,magnesium, calcium). Pt kept on insisting that we were looking at 2 different things? I explained I was looking at the same thing that he was given.  Pt also stated he would like an RX for Flonase? Please advise?

## 2013-07-21 ENCOUNTER — Telehealth: Payer: Self-pay | Admitting: Physician Assistant

## 2013-07-21 ENCOUNTER — Ambulatory Visit (INDEPENDENT_AMBULATORY_CARE_PROVIDER_SITE_OTHER): Payer: Self-pay | Admitting: Physician Assistant

## 2013-07-21 ENCOUNTER — Encounter: Payer: Self-pay | Admitting: Physician Assistant

## 2013-07-21 VITALS — BP 132/86 | HR 100 | Temp 98.4°F | Resp 16 | Ht 70.0 in | Wt 238.5 lb

## 2013-07-21 DIAGNOSIS — R195 Other fecal abnormalities: Secondary | ICD-10-CM

## 2013-07-21 DIAGNOSIS — J209 Acute bronchitis, unspecified: Secondary | ICD-10-CM

## 2013-07-21 DIAGNOSIS — J329 Chronic sinusitis, unspecified: Secondary | ICD-10-CM

## 2013-07-21 MED ORDER — AZITHROMYCIN 250 MG PO TABS: ORAL_TABLET | ORAL | Status: DC

## 2013-07-21 MED ORDER — BENZONATATE 200 MG PO CAPS: 200.0000 mg | ORAL_CAPSULE | Freq: Three times a day (TID) | ORAL | Status: DC | PRN

## 2013-07-21 NOTE — Patient Instructions (Signed)
Increase fluid intake.  Take antibiotics and tessalon perles as prescribed.  Continue with Mucinex and Saline nasal spray.  Honey is also good for cough.  Continue probiotic.  Please let us know if loose stools worsen.  I feel this will improve once the cough has calmed down.  If it worsens, you need to come in to give [place] a stool sample for testing.

## 2013-07-21 NOTE — Telephone Encounter (Signed)
Opened in error

## 2013-07-21 NOTE — Progress Notes (Signed)
Pre visit review using our clinic review tool, if applicable. No additional management support is needed unless otherwise documented below in the visit note/SLS  

## 2013-07-22 DIAGNOSIS — J209 Acute bronchitis, unspecified: Secondary | ICD-10-CM | POA: Insufficient documentation

## 2013-07-22 DIAGNOSIS — R195 Other fecal abnormalities: Secondary | ICD-10-CM | POA: Insufficient documentation

## 2013-07-22 NOTE — Assessment & Plan Note (Signed)
Patient still with some mild tenderness, despite antibiotic therapy. Will Rx azithromycin. Patient instructed to increase fluid intake, rest. Use saline nasal spray, use Mucinex as needed. Please humidifier in bedroom. Rx Tessalon Perles for cough. Probiotic daily.

## 2013-07-22 NOTE — Progress Notes (Signed)
Patient presents to clinic today c/o continued upper respiratory symptoms. Patient was seen on January 12 by his PCP and diagnosed with sinusitis. Patient was given a prescription for Cefdinir, which he endorses taking as prescribed. Patient states his symptoms started to improve while on the medication, but then came back "full force". Patient states he feels the symptoms have now moved more from his sinuses into his chest. Patient endorses a persistent cough that is sometimes productive, but mostly nonproductive. Patient endorses some shortness of breath secondary to persistent cough. Patient denies pleuritic chest pain. Patient denies wheezing. Patient without history of asthma. Patient endorses some residual nasal congestion, but denies sinus pressure and sinus pain. Patient also reports these had some frequent loose stools over the past week. Patient does have history of irritable bowel syndrome, endorsing about 3-4 loose stools per day at his baseline. Patient endorses is now having 5-7 loose bowel movements per day. Denies large amount of stool with each bowel movement. Patient denies tenesmus, melena, or hematochezia. Denies abdominal pain, nausea or vomiting. Has not taken anything for his loose stools.  Past Medical History  Diagnosis Date  . Hyperlipidemia   . IBS (irritable bowel syndrome)   . Elevated blood pressure (not hypertension)   . Hamartoma   . Insomnia 09/26/2012    Current Outpatient Prescriptions on File Prior to Visit  Medication Sig Dispense Refill  . amphetamine-dextroamphetamine (ADDERALL) 20 MG tablet Take 20 mg by mouth 3 (three) times daily.        . diphenhydrAMINE (BENADRYL) 25 mg capsule Take 1 capsule (25 mg total) by mouth at bedtime as needed for itching, allergies or sleep.  30 capsule  0  . escitalopram (LEXAPRO) 20 MG tablet Take 1 tablet (20 mg total) by mouth daily.      . fluticasone (FLONASE) 50 MCG/ACT nasal spray Place 2 sprays into both nostrils daily.  16  g  6  . rosuvastatin (CRESTOR) 20 MG tablet Take 1 tablet (20 mg total) by mouth daily.  30 tablet  5   No current facility-administered medications on file prior to visit.    Allergies  Allergen Reactions  . Codeine     Adverse reaction, per pt  . Simvastatin     Family History  Problem Relation Age of Onset  . Diabetes    . Hyperlipidemia    . Cancer      PGM ? digestive  . Kidney disease      History   Social History  . Marital Status: Married    Spouse Name: N/A    Number of Children: N/A  . Years of Education: N/A   Occupational History  . [employer]     Social History Main Topics  . Smoking status: Former Smoker -- 2.00 packs/day    Types: Cigarettes    Start date: 10/01/2006  . Smokeless tobacco: Never Used  . Alcohol Use: Yes  . Drug Use: No  . Sexual Activity: None   Other Topics Concern  . None   Social History Narrative  . None   Review of Systems - See HPI.  All other ROS are negative.  Filed Vitals:   07/21/13 1651  BP: 132/86  Pulse: 100  Temp: 98.4 F (36.9 C)  Resp: 16   Physical Exam  Vitals reviewed. Constitutional: He is oriented to person, place, and time and well-developed, well-nourished, and in no distress.  HENT:  Head: Normocephalic and atraumatic.  Right Ear: External ear  normal.  Left Ear: External ear normal.  Nose: Nose normal.  Mouth/Throat: Oropharynx is clear and moist. No oropharyngeal exudate.  Tympanic membranes within normal limits bilaterally. Mild tenderness to percussion over maxillary sinuses.  Eyes: Conjunctivae and EOM are normal. Pupils are equal, round, and reactive to light.  Neck: Neck supple.  Cardiovascular: Normal rate, regular rhythm, normal heart sounds and intact distal pulses.   Pulmonary/Chest: Effort normal and breath sounds normal. No respiratory distress. He has no wheezes. He has no rales. He exhibits no tenderness.  Abdominal: Soft. Bowel sounds are normal. He  exhibits no distension. There is no tenderness.  Lymphadenopathy:    He has no cervical adenopathy.  Neurological: He is alert and oriented to person, place, and time.  Skin: Skin is warm and dry. No rash noted.  Psychiatric: Affect normal.    No results found for this or any previous visit (from the past 2160 hour(s)).  Assessment/Plan: No problem-specific assessment & plan notes found for this encounter.

## 2013-07-22 NOTE — Assessment & Plan Note (Signed)
Rx azithromycin. Increase fluid intake. Rest. Rx'd Tessalon Perles for cough. Saline nasal spray. Humidifier in bedroom. Call or to clinic if symptoms not improving.

## 2013-07-22 NOTE — Assessment & Plan Note (Addendum)
Patient with history of IBS. However, patient was recently on antibiotic therapy. Patient instructed to take Imodium if needed for frequent loose stools. Patient also given materials needed to collect stool sample. Will obtain stool culture, O&P and C. difficile assay. Main concern is for C. difficile given recent antibiotics. Will treat according to results.

## 2013-08-18 IMAGING — US US EXTREM LOW VENOUS*L*
1 series · 13 of 24 positions shown · non-contrast
Comparison: None.

CLINICAL DATA: Left thigh pain and bruising.

LEFT LOWER EXTREMITY VENOUS DUPLEX ULTRASOUND
TECHNIQUE: Gray-scale sonography with graded compression, as well
as color Doppler and duplex ultrasound were performed to evaluate
the deep venous system of the lower extremity from the level of the
common femoral vein through the popliteal and proximal calf veins.
Spectral Doppler was utilized to evaluate flow at rest and with
distal augmentation maneuvers.

[Series 1: us extrem low venous*left* · 13 of 45 slices shown]
[im 1/45]
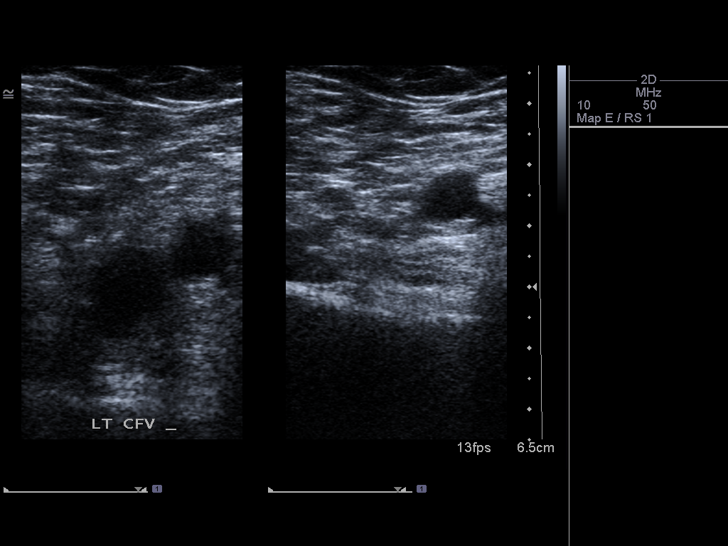
[im 4/45]
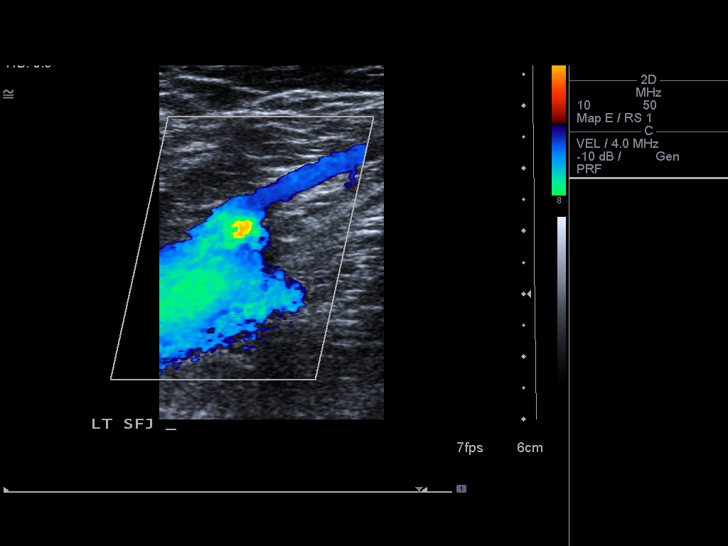
[im 8/45]
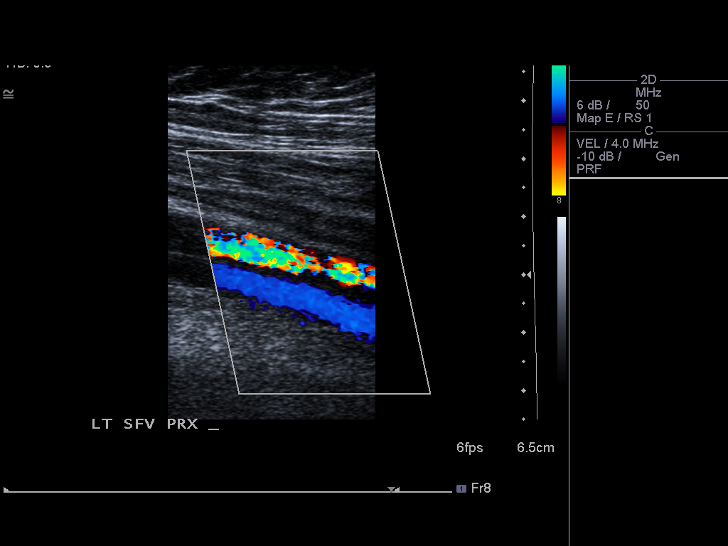
[im 12/45]
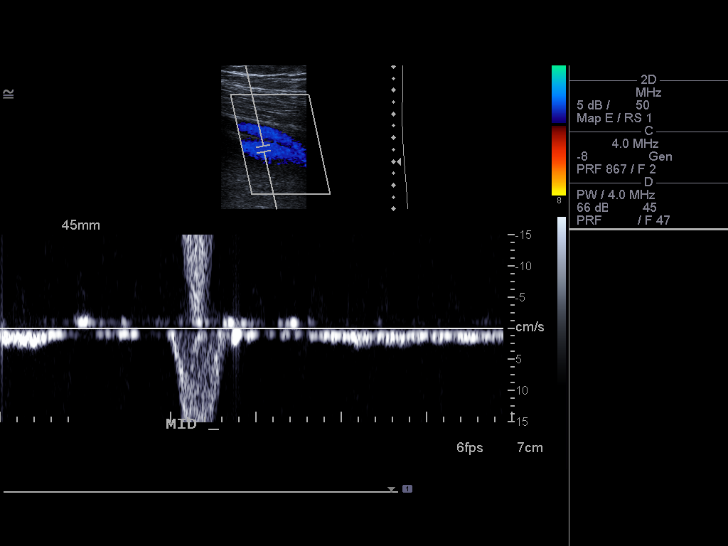
[im 16/45]
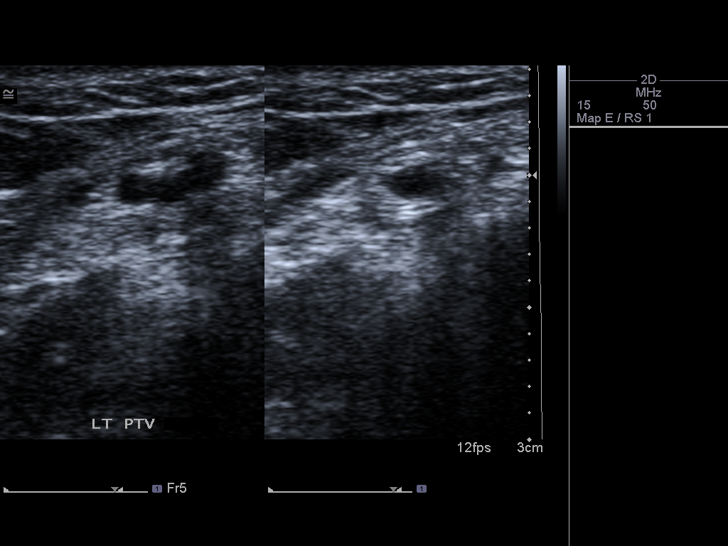
[im 20/45]
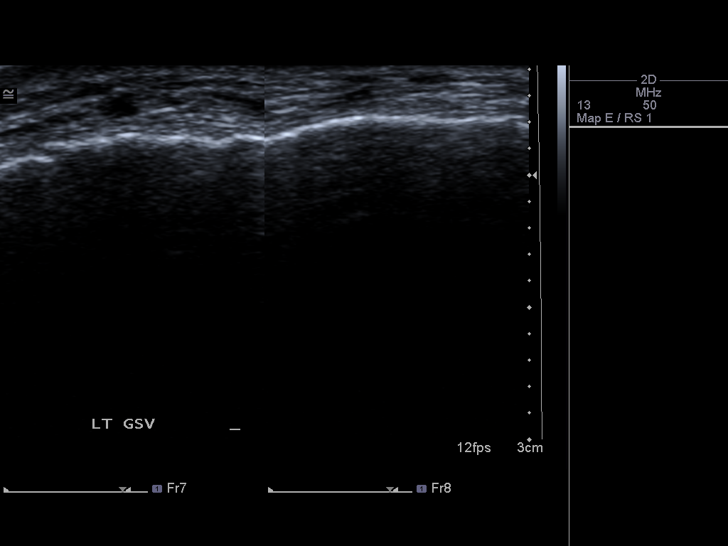
[im 23/45]
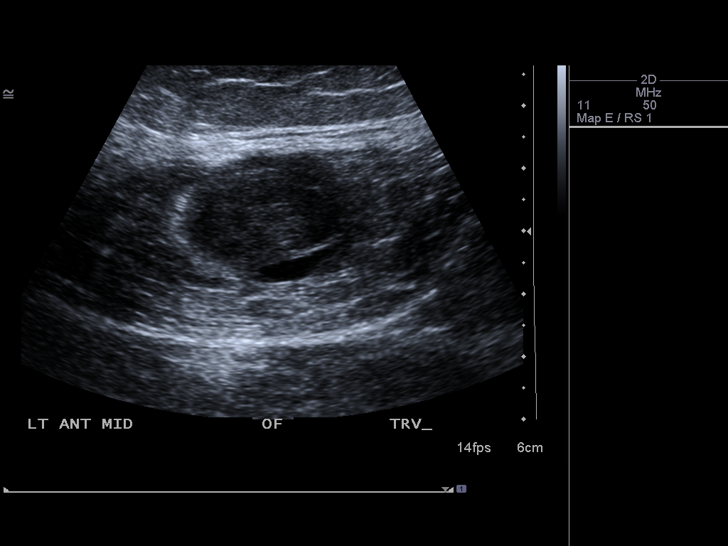
[im 25/45]
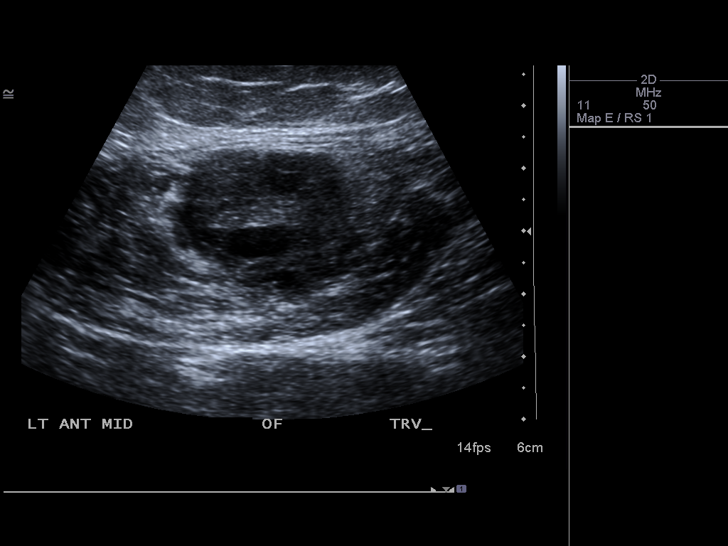
[im 29/45]
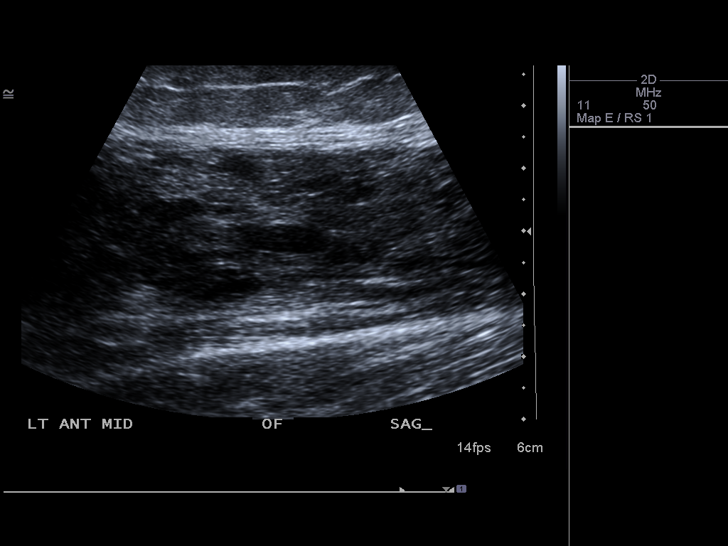
[im 33/45]
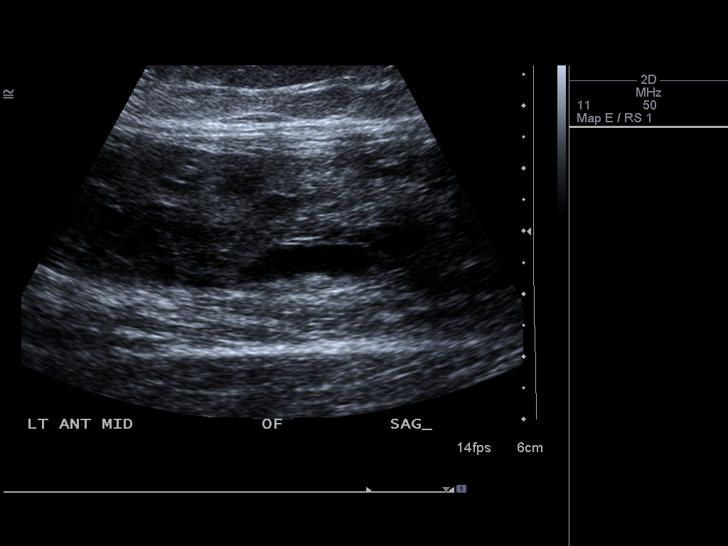
[im 37/45]
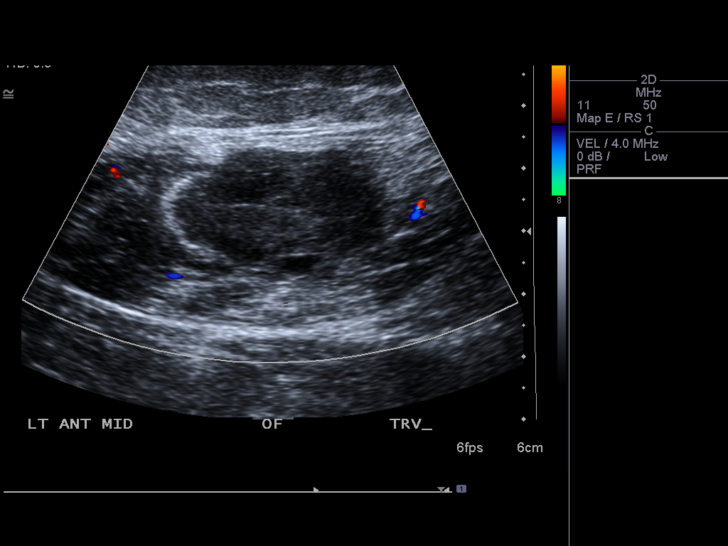
[im 41/45]
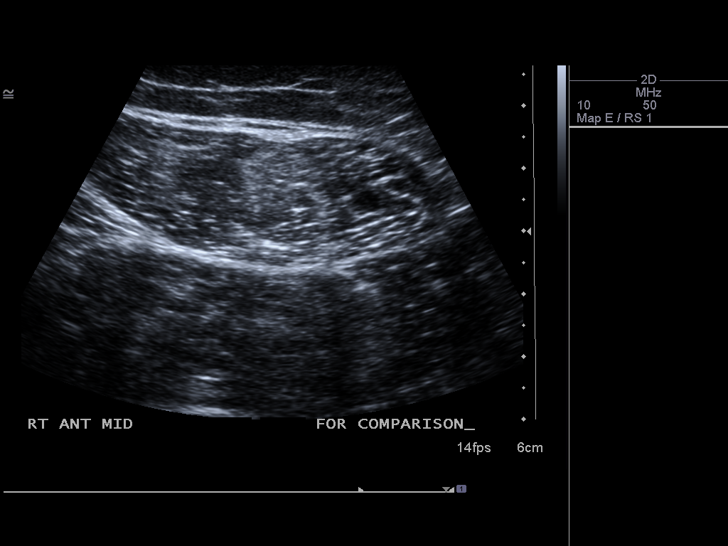
[im 45/45]
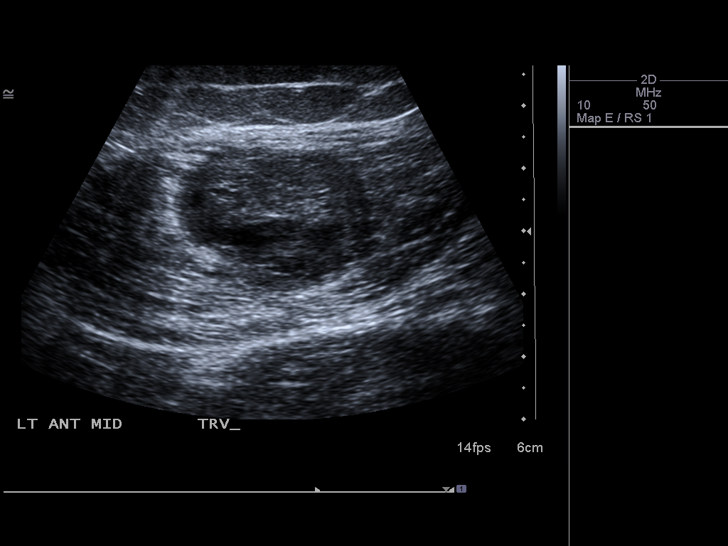

[13 of 24 positions shown; findings below may reference images not displayed]

FINDINGS: Normal compressibility of the common femoral,
superficial femoral, and popliteal veins is demonstrated, as well
as the visualized proximal calf veins.  No filling defects to
suggest DVT on grayscale or color Doppler imaging.  Doppler
waveforms show normal direction of venous flow, normal respiratory
phasicity and response to augmentation.

There is an area of anterior mid thigh soft tissue abnormality
which appears to be within the musculature.  Area of heterogeneity
is difficult to measure but is roughly 10 x 3 x 2 cm in dimension
and has the appearance of an intramuscular hematoma.
IMPRESSION: 1.  No evidence of left lower extremity deep vein thrombosis.
2.  Elongated soft tissue abnormality within the anterior mid thigh
quadriceps musculature likely representing intramuscular
hemorrhage.

## 2013-09-13 ENCOUNTER — Encounter: Payer: Self-pay | Admitting: Internal Medicine

## 2013-10-01 ENCOUNTER — Ambulatory Visit (INDEPENDENT_AMBULATORY_CARE_PROVIDER_SITE_OTHER): Payer: BC Managed Care – PPO | Admitting: Physician Assistant

## 2013-10-01 ENCOUNTER — Encounter: Payer: Self-pay | Admitting: Physician Assistant

## 2013-10-01 VITALS — BP 124/86 | HR 99 | Temp 98.7°F | Resp 18 | Ht 70.0 in | Wt 238.0 lb

## 2013-10-01 DIAGNOSIS — R05 Cough: Secondary | ICD-10-CM

## 2013-10-01 DIAGNOSIS — R059 Cough, unspecified: Secondary | ICD-10-CM

## 2013-10-01 DIAGNOSIS — H6690 Otitis media, unspecified, unspecified ear: Secondary | ICD-10-CM | POA: Insufficient documentation

## 2013-10-01 DIAGNOSIS — J329 Chronic sinusitis, unspecified: Secondary | ICD-10-CM

## 2013-10-01 MED ORDER — PROMETHAZINE-DM 6.25-15 MG/5ML PO SYRP: 5.0000 mL | ORAL_SOLUTION | Freq: Four times a day (QID) | ORAL | Status: DC | PRN

## 2013-10-01 MED ORDER — AZITHROMYCIN 250 MG PO TABS: ORAL_TABLET | ORAL | Status: DC

## 2013-10-01 NOTE — Progress Notes (Signed)
Patient presents to clinic today c/o 1.5 weeks of sinus pressure, sinus pain, cough, fatigue and right ear pressure/pain.  Patient denies fever, chills, aches, shortness of breath or wheezing.  Patient with prior history of sinus infections.  Patient states that the last antibiotic given for sinus infection worked the best.  Past Medical History  Diagnosis Date  . Hyperlipidemia   . IBS (irritable bowel syndrome)   . Elevated blood pressure (not hypertension)   . Hamartoma   . Insomnia 09/26/2012    Current Outpatient Prescriptions on File Prior to Visit  Medication Sig Dispense Refill  . amphetamine-dextroamphetamine (ADDERALL) 20 MG tablet Take 20 mg by mouth 3 (three) times daily.        . diphenhydrAMINE (BENADRYL) 25 mg capsule Take 1 capsule (25 mg total) by mouth at bedtime as needed for itching, allergies or sleep.  30 capsule  0  . escitalopram (LEXAPRO) 20 MG tablet Take 1 tablet (20 mg total) by mouth daily.      . fluticasone (FLONASE) 50 MCG/ACT nasal spray Place 2 sprays into both nostrils daily.  16 g  6  . rosuvastatin (CRESTOR) 20 MG tablet Take 1 tablet (20 mg total) by mouth daily.  30 tablet  5   No current facility-administered medications on file prior to visit.    Allergies  Allergen Reactions  . Codeine     Adverse reaction, per pt  . Simvastatin     Family History  Problem Relation Age of Onset  . Diabetes    . Hyperlipidemia    . Cancer      PGM ? digestive  . Kidney disease      History   Social History  . Marital Status: Married    Spouse Name: N/A    Number of Children: N/A  . Years of Education: N/A   Occupational History  . [employer]     Social History Main Topics  . Smoking status: Former Smoker -- 2.00 packs/day    Types: Cigarettes    Start date: 10/01/2006  . Smokeless tobacco: Never Used  . Alcohol Use: Yes  . Drug Use: No  . Sexual Activity: None   Other Topics Concern  . None   Social History  Narrative  . None   Review of Systems - See HPI.  All other ROS are negative.  BP 124/86  Pulse 99  Temp(Src) 98.7 F (37.1 C) (Oral)  Resp 18  Ht 5\' 10"  (1.778 m)  Wt 238 lb (107.956 kg)  BMI 34.15 kg/m2  SpO2 97%  Physical Exam  Vitals reviewed. Constitutional: He is oriented to person, place, and time and well-developed, well-nourished, and in no distress.  HENT:  Head: Normocephalic and atraumatic.  Right Ear: External ear normal.  Left Ear: External ear normal.  Nose: Nose normal.  Mouth/Throat: Oropharynx is clear and moist.  L TM within normal limits.  R TM erythematous and bulging. + TTP of Right frontal sinus.  Eyes: Conjunctivae are normal. Pupils are equal, round, and reactive to light.  Neck: Neck supple.  Cardiovascular: Normal rate, regular rhythm, normal heart sounds and intact distal pulses.   Pulmonary/Chest: Effort normal and breath sounds normal. He has no wheezes. He has no rales. He exhibits no tenderness.  Neurological: He is alert and oriented to person, place, and time.  Skin: Skin is warm and dry. No rash noted.  Psychiatric: Affect normal.   Assessment/Plan: Sinusitis Rx Azithromycin.  Rx Promethazine-DM for cough.  Increase fluid intake.  Rest.  Neti Pot with distilled water.  Multivitamin. Mucinex.  Rest.  Humidifier in bedroom.  Otitis media Rx Azithromycin for AOM and sinusitis.

## 2013-10-01 NOTE — Patient Instructions (Addendum)
Please take antibiotic as directed.  Increase fluid intake.  Use cough syrup as directed.  Continue Neti Pot but use distilled water.  Mucinex.  Multivitamin.  Place a humidifier in bedroom.  Call or return to clinic if symptoms are not improving.  Sinusitis Sinusitis is redness, soreness, and swelling (inflammation) of the paranasal sinuses. Paranasal sinuses are air pockets within the bones of your face (beneath the eyes, the middle of the forehead, or above the eyes). In healthy paranasal sinuses, mucus is able to drain out, and air is able to circulate through them by way of your nose. However, when your paranasal sinuses are inflamed, mucus and air can become trapped. This can allow bacteria and other germs to grow and cause infection. Sinusitis can develop quickly and last only a short time (acute) or continue over a long period (chronic). Sinusitis that lasts for more than 12 weeks is considered chronic.  CAUSES  Causes of sinusitis include:  Allergies.  Structural abnormalities, such as displacement of the cartilage that separates your nostrils (deviated septum), which can decrease the air flow through your nose and sinuses and affect sinus drainage.  Functional abnormalities, such as when the small hairs (cilia) that line your sinuses and help remove mucus do not work properly or are not present. SYMPTOMS  Symptoms of acute and chronic sinusitis are the same. The primary symptoms are pain and pressure around the affected sinuses. Other symptoms include:  Upper toothache.  Earache.  Headache.  Bad breath.  Decreased sense of smell and taste.  A cough, which worsens when you are lying flat.  Fatigue.  Fever.  Thick drainage from your nose, which often is green and may contain pus (purulent).  Swelling and warmth over the affected sinuses. DIAGNOSIS  Your caregiver will perform a physical exam. During the exam, your caregiver may:  Look in your nose for signs of abnormal  growths in your nostrils (nasal polyps).  Tap over the affected sinus to check for signs of infection.  View the inside of your sinuses (endoscopy) with a special imaging device with a light attached (endoscope), which is inserted into your sinuses. If your caregiver suspects that you have chronic sinusitis, one or more of the following tests may be recommended:  Allergy tests.  Nasal culture A sample of mucus is taken from your nose and sent to a lab and screened for bacteria.  Nasal cytology A sample of mucus is taken from your nose and examined by your caregiver to determine if your sinusitis is related to an allergy. TREATMENT  Most cases of acute sinusitis are related to a viral infection and will resolve on their own within 10 days. Sometimes medicines are prescribed to help relieve symptoms (pain medicine, decongestants, nasal steroid sprays, or saline sprays).  However, for sinusitis related to a bacterial infection, your caregiver will prescribe antibiotic medicines. These are medicines that will help kill the bacteria causing the infection.  Rarely, sinusitis is caused by a fungal infection. In theses cases, your caregiver will prescribe antifungal medicine. For some cases of chronic sinusitis, surgery is needed. Generally, these are cases in which sinusitis recurs more than 3 times per year, despite other treatments. HOME CARE INSTRUCTIONS   Drink plenty of water. Water helps thin the mucus so your sinuses can drain more easily.  Use a humidifier.  Inhale steam 3 to 4 times a day (for example, sit in the bathroom with the shower running).  Apply a warm, moist washcloth to your  face 3 to 4 times a day, or as directed by your caregiver.  Use saline nasal sprays to help moisten and clean your sinuses.  Take over-the-counter or prescription medicines for pain, discomfort, or fever only as directed by your caregiver. SEEK IMMEDIATE MEDICAL CARE IF:  You have increasing pain or  severe headaches.  You have nausea, vomiting, or drowsiness.  You have swelling around your face.  You have vision problems.  You have a stiff neck.  You have difficulty breathing. MAKE SURE YOU:   Understand these instructions.  Will watch your condition.  Will get help right away if you are not doing well or get worse. Document Released: 06/10/2005 Document Revised: 09/02/2011 Document Reviewed: 06/25/2011 [facility] Patient Information 2014 [place], [place].

## 2013-10-01 NOTE — Assessment & Plan Note (Signed)
Rx Azithromycin.  Rx Promethazine-DM for cough.  Increase fluid intake.  Rest.  Neti Pot with distilled water.  Multivitamin. Mucinex.  Rest.  Humidifier in bedroom.

## 2013-10-01 NOTE — Progress Notes (Signed)
Pre visit review using our clinic review tool, if applicable. No additional management support is needed unless otherwise documented below in the visit note/SLS  

## 2013-10-01 NOTE — Assessment & Plan Note (Signed)
Rx Azithromycin for AOM and sinusitis.

## 2014-01-24 ENCOUNTER — Other Ambulatory Visit: Payer: Self-pay | Admitting: Family Medicine

## 2014-02-21 ENCOUNTER — Other Ambulatory Visit: Payer: Self-pay | Admitting: Family Medicine

## 2014-02-21 NOTE — Telephone Encounter (Signed)
RX for 30 days went to pharmacy.   Please call and inform pt that he needs to schedule a follow up appt for addt refills

## 2014-02-21 NOTE — Telephone Encounter (Signed)
Left message for patient to return my call.

## 2014-02-21 NOTE — Telephone Encounter (Signed)
Appointment scheduled for late september

## 2014-03-21 ENCOUNTER — Encounter: Payer: Self-pay | Admitting: Family Medicine

## 2014-03-21 ENCOUNTER — Ambulatory Visit (INDEPENDENT_AMBULATORY_CARE_PROVIDER_SITE_OTHER): Payer: BC Managed Care – PPO | Admitting: Family Medicine

## 2014-03-21 VITALS — BP 142/94 | HR 80 | Temp 98.4°F | Ht 70.0 in | Wt 243.2 lb

## 2014-03-21 DIAGNOSIS — R740 Nonspecific elevation of levels of transaminase and lactic acid dehydrogenase [LDH]: Secondary | ICD-10-CM

## 2014-03-21 DIAGNOSIS — R7402 Elevation of levels of lactic acid dehydrogenase (LDH): Secondary | ICD-10-CM

## 2014-03-21 DIAGNOSIS — T7840XD Allergy, unspecified, subsequent encounter: Secondary | ICD-10-CM

## 2014-03-21 DIAGNOSIS — E785 Hyperlipidemia, unspecified: Secondary | ICD-10-CM

## 2014-03-21 DIAGNOSIS — Z5189 Encounter for other specified aftercare: Secondary | ICD-10-CM

## 2014-03-21 DIAGNOSIS — R7401 Elevation of levels of liver transaminase levels: Secondary | ICD-10-CM

## 2014-03-21 DIAGNOSIS — R030 Elevated blood-pressure reading, without diagnosis of hypertension: Secondary | ICD-10-CM

## 2014-03-21 DIAGNOSIS — F988 Other specified behavioral and emotional disorders with onset usually occurring in childhood and adolescence: Secondary | ICD-10-CM

## 2014-03-21 DIAGNOSIS — Z23 Encounter for immunization: Secondary | ICD-10-CM

## 2014-03-21 MED ORDER — MONTELUKAST SODIUM 10 MG PO TABS: 10.0000 mg | ORAL_TABLET | Freq: Every evening | ORAL | Status: DC | PRN

## 2014-03-21 NOTE — Progress Notes (Signed)
Pre visit review using our clinic review tool, if applicable. No additional management support is needed unless otherwise documented below in the visit note. 

## 2014-03-21 NOTE — Patient Instructions (Addendum)
Below 140/90  Zyrtec/Claritin 10 mg daily, Flonase daily and nasal saline as needed after being outside  Hypertension Hypertension, commonly called high blood pressure, is when the force of blood pumping through your arteries is too strong. Your arteries are the blood vessels that carry blood from your heart throughout your body. A blood pressure reading consists of a higher number over a lower number, such as 110/72. The higher number (systolic) is the pressure inside your arteries when your heart pumps. The lower number (diastolic) is the pressure inside your arteries when your heart relaxes. Ideally you want your blood pressure below 120/80. Hypertension forces your heart to work harder to pump blood. Your arteries may become narrow or stiff. Having hypertension puts you at risk for heart disease, stroke, and other problems.  RISK FACTORS Some risk factors for high blood pressure are controllable. Others are not.  Risk factors you cannot control include:   Race. You may be at higher risk if you are African American.  Age. Risk increases with age.  Gender. Men are at higher risk than women before age 47 years. After age 48, women are at higher risk than men. Risk factors you can control include:  Not getting enough exercise or physical activity.  Being overweight.  Getting too much fat, sugar, calories, or salt in your diet.  Drinking too much alcohol. SIGNS AND SYMPTOMS Hypertension does not usually cause signs or symptoms. Extremely high blood pressure (hypertensive crisis) may cause headache, anxiety, shortness of breath, and nosebleed. DIAGNOSIS  To check if you have hypertension, your health care provider will measure your blood pressure while you are seated, with your arm held at the level of your heart. It should be measured at least twice using the same arm. Certain conditions can cause a difference in blood pressure between your right and left arms. A blood pressure reading  that is higher than normal on one occasion does not mean that you need treatment. If one blood pressure reading is high, ask your health care provider about having it checked again. TREATMENT  Treating high blood pressure includes making lifestyle changes and possibly taking medicine. Living a healthy lifestyle can help lower high blood pressure. You may need to change some of your habits. Lifestyle changes may include:  Following the DASH diet. This diet is high in fruits, vegetables, and whole grains. It is low in salt, red meat, and added sugars.  Getting at least 2 hours of brisk physical activity every week.  Losing weight if necessary.  Not smoking.  Limiting alcoholic beverages.  Learning ways to reduce stress. If lifestyle changes are not enough to get your blood pressure under control, your health care provider may prescribe medicine. You may need to take more than one. Work closely with your health care provider to understand the risks and benefits. HOME CARE INSTRUCTIONS  Have your blood pressure rechecked as directed by your health care provider.   Take medicines only as directed by your health care provider. Follow the directions carefully. Blood pressure medicines must be taken as prescribed. The medicine does not work as well when you skip doses. Skipping doses also puts you at risk for problems.   Do not smoke.   Monitor your blood pressure at home as directed by your health care provider. SEEK MEDICAL CARE IF:   You think you are having a reaction to medicines taken.  You have recurrent headaches or feel dizzy.  You have swelling in your ankles.  You  have trouble with your vision. SEEK IMMEDIATE MEDICAL CARE IF:  You develop a severe headache or confusion.  You have unusual weakness, numbness, or feel faint.  You have severe chest or abdominal pain.  You vomit repeatedly.  You have trouble breathing. MAKE SURE YOU:   Understand these  instructions.  Will watch your condition.  Will get help right away if you are not doing well or get worse. Document Released: 06/10/2005 Document Revised: 10/25/2013 Document Reviewed: 04/02/2013 [facility] Patient Information 2015 [place], [place]. This information is not intended to replace advice given to you by your health care provider. Make sure you discuss any questions you have with your health care provider.

## 2014-03-23 ENCOUNTER — Encounter: Payer: Self-pay | Admitting: Family Medicine

## 2014-03-23 NOTE — Assessment & Plan Note (Signed)
Medications working well. Obtains from his psychiatrist

## 2014-03-23 NOTE — Assessment & Plan Note (Signed)
Encouraged heart healthy diet, increase exercise, avoid trans fats, consider a krill oil cap daily 

## 2014-03-23 NOTE — Progress Notes (Signed)
Patient ID: SONIA STICKELS, male   DOB: [birth year], 38 y.o.   MRN: [id number] TREJON DUFORD [id number] 1976-03-10 03/23/2014      Progress Note-Follow Up  Subjective  Chief Complaint  Chief Complaint  Patient presents with  . Medication Refill  . Injections    tdap    HPI  Patient is a 38 year old male in today for routine medical care. He is in today for medication refills. Reports generally doing well. No recent changes. Usually takes just one Adderall a day but did need to today. No recent illness. Denies CP/palp/SOB/HA/congestion/fevers/GI or GU c/o. Taking meds as prescribed.  Past Medical History  Diagnosis Date  . Hyperlipidemia   . IBS (irritable bowel syndrome)   . Elevated blood pressure (not hypertension)   . Hamartoma   . Insomnia 09/26/2012    Past Surgical History  Procedure Laterality Date  . Lung biopsy      FNA under xray guidance 1998 @ [facility]    Family History  Problem Relation Age of Onset  . Diabetes    . Hyperlipidemia    . Cancer      PGM ? digestive  . Kidney disease      History   Social History  . Marital Status: Married    Spouse Name: N/A    Number of Children: N/A  . Years of Education: N/A   Occupational History  . [employer]     Social History Main Topics  . Smoking status: Former Smoker -- 2.00 packs/day    Types: Cigarettes    Start date: 10/01/2006  . Smokeless tobacco: Never Used  . Alcohol Use: Yes  . Drug Use: No  . Sexual Activity: Not on file   Other Topics Concern  . Not on file   Social History Narrative  . No narrative on file    Current Outpatient Prescriptions on File Prior to Visit  Medication Sig Dispense Refill  . amphetamine-dextroamphetamine (ADDERALL) 20 MG tablet Take 20 mg by mouth 3 (three) times daily.        . CRESTOR 20 MG tablet TAKE 1 TABLET BY MOUTH DAILY  30 tablet  0  . escitalopram (LEXAPRO) 20 MG tablet Take 1 tablet (20 mg total) by mouth daily.       No  current facility-administered medications on file prior to visit.    Allergies  Allergen Reactions  . Codeine     Adverse reaction, per pt  . Simvastatin     Review of Systems  Review of Systems  Constitutional: Negative for fever and malaise/fatigue.  HENT: Negative for congestion.   Eyes: Negative for discharge.  Respiratory: Negative for shortness of breath.   Cardiovascular: Negative for chest pain, palpitations and leg swelling.  Gastrointestinal: Negative for nausea, abdominal pain and diarrhea.  Genitourinary: Negative for dysuria.  Musculoskeletal: Negative for falls.  Skin: Negative for rash.  Neurological: Negative for loss of consciousness and headaches.  Endo/Heme/Allergies: Negative for polydipsia.  Psychiatric/Behavioral: Negative for depression and suicidal ideas. The patient is not nervous/anxious and does not have insomnia.     Objective  BP 142/94  Pulse 80  Temp(Src) 98.4 F (36.9 C) (Oral)  Ht 5\' 10"  (1.778 m)  Wt 243 lb 3.2 oz (110.315 kg)  BMI 34.90 kg/m2  SpO2 99%  Physical Exam  Physical Exam  Constitutional: He is oriented to person, place, and time and well-developed, well-nourished, and in no distress. No distress.  HENT:  Head:  Normocephalic and atraumatic.  Eyes: Conjunctivae are normal.  Neck: Neck supple. No thyromegaly present.  Cardiovascular: Normal rate, regular rhythm and normal heart sounds.   No murmur heard. Pulmonary/Chest: Effort normal and breath sounds normal. No respiratory distress.  Abdominal: He exhibits no distension and no mass. There is no tenderness.  Musculoskeletal: He exhibits no edema.  Neurological: He is alert and oriented to person, place, and time.  Skin: Skin is warm.  Psychiatric: Memory, affect and judgment normal.    Lab Results  Component Value Date   TSH 1.428 11/30/2012   Lab Results  Component Value Date   WBC 4.1 11/30/2012   HGB 13.9 11/30/2012   HCT 40.8 11/30/2012   MCV 87.2 11/30/2012   PLT  227 11/30/2012   Lab Results  Component Value Date   CREATININE 0.90 11/30/2012   BUN 15 11/30/2012   NA 138 11/30/2012   K 4.9 11/30/2012   CL 103 11/30/2012   CO2 27 11/30/2012   Lab Results  Component Value Date   ALT 35 11/30/2012   AST 32 11/30/2012   ALKPHOS 60 11/30/2012   BILITOT 0.5 11/30/2012   Lab Results  Component Value Date   CHOL 162 11/30/2012   Lab Results  Component Value Date   HDL 46 11/30/2012   Lab Results  Component Value Date   LDLCALC 63 11/30/2012   Lab Results  Component Value Date   TRIG 263* 11/30/2012   Lab Results  Component Value Date   CHOLHDL 3.5 11/30/2012     Assessment & Plan  ELEVATED BLOOD PRESSURE WITHOUT DIAGNOSIS OF HYPERTENSION Mildly elevated today Encouraged heart healthy diet such as the DASH diet and exercise as tolerated. Normally only takes one dose of his Adderall daily but today took 2 will try to take one daily Took flu shot last week and took Tdap today  ADD (attention deficit disorder) Medications working well. Obtains from his psychiatrist  HYPERLIPIDEMIA Encouraged heart healthy diet, increase exercise, avoid trans fats, consider a krill oil cap daily  TRANSAMINASES, SERUM, ELEVATED Resolved with last blood draw

## 2014-03-23 NOTE — Assessment & Plan Note (Addendum)
Mildly elevated today Encouraged heart healthy diet such as the DASH diet and exercise as tolerated. Normally only takes one dose of his Adderall daily but today took 2 will try to take one daily Took flu shot last week and took Tdap today

## 2014-03-23 NOTE — Assessment & Plan Note (Signed)
Resolved with last blood draw

## 2014-03-26 ENCOUNTER — Other Ambulatory Visit: Payer: Self-pay | Admitting: Family Medicine

## 2014-03-28 NOTE — Telephone Encounter (Signed)
Rx request to pharmacy/SLS  

## 2014-07-05 ENCOUNTER — Other Ambulatory Visit: Payer: Self-pay | Admitting: Family Medicine

## 2014-07-05 NOTE — Telephone Encounter (Signed)
Rx sent to the pharmacy by e-script.//AB/CMA 

## 2014-08-31 ENCOUNTER — Other Ambulatory Visit: Payer: Self-pay | Admitting: Family Medicine

## 2014-08-31 NOTE — Telephone Encounter (Signed)
Last filled:  07/05/14 Amt: 30, 1 refill Last OV:  03/21/14  Needs an appointment.  Left a message for call back.

## 2014-09-01 NOTE — Telephone Encounter (Signed)
Called the patient left detailed message to schedule appointment with his Primary Care physician for further refills.   Last refill 07/05/14 #30 with 1 refill Last OV 03/21/14, labs were ordered for this visit but patient did not do. Last labs 11/30/12

## 2014-09-28 ENCOUNTER — Other Ambulatory Visit: Payer: Self-pay | Admitting: Family Medicine

## 2015-08-16 ENCOUNTER — Telehealth: Payer: Self-pay | Admitting: Family Medicine

## 2015-08-16 NOTE — Telephone Encounter (Signed)
Ok with me 

## 2015-08-16 NOTE — Telephone Encounter (Signed)
Fine with me

## 2015-08-16 NOTE — Telephone Encounter (Signed)
Pt would like to change from Dr. Charlett Blake to Dr. Etter Sjogren for PCP. Please advise.

## 2015-08-17 ENCOUNTER — Encounter: Payer: Self-pay | Admitting: Family Medicine

## 2015-08-17 ENCOUNTER — Ambulatory Visit (HOSPITAL_BASED_OUTPATIENT_CLINIC_OR_DEPARTMENT_OTHER)
Admission: RE | Admit: 2015-08-17 | Discharge: 2015-08-17 | Disposition: A | Discharge status: Home / Self Care | Payer: BLUE CROSS/BLUE SHIELD | Source: Ambulatory Visit | Attending: Family Medicine | Admitting: Family Medicine

## 2015-08-17 ENCOUNTER — Ambulatory Visit (INDEPENDENT_AMBULATORY_CARE_PROVIDER_SITE_OTHER): Payer: BLUE CROSS/BLUE SHIELD | Admitting: Family Medicine

## 2015-08-17 VITALS — BP 116/78 | HR 85 | Temp 98.2°F | Ht 70.5 in | Wt 226.5 lb

## 2015-08-17 DIAGNOSIS — R918 Other nonspecific abnormal finding of lung field: Secondary | ICD-10-CM

## 2015-08-17 DIAGNOSIS — D1431 Benign neoplasm of right bronchus and lung: Secondary | ICD-10-CM

## 2015-08-17 DIAGNOSIS — R030 Elevated blood-pressure reading, without diagnosis of hypertension: Secondary | ICD-10-CM | POA: Diagnosis not present

## 2015-08-17 DIAGNOSIS — F909 Attention-deficit hyperactivity disorder, unspecified type: Secondary | ICD-10-CM

## 2015-08-17 DIAGNOSIS — E782 Mixed hyperlipidemia: Secondary | ICD-10-CM | POA: Diagnosis not present

## 2015-08-17 DIAGNOSIS — K589 Irritable bowel syndrome without diarrhea: Secondary | ICD-10-CM

## 2015-08-17 DIAGNOSIS — J984 Other disorders of lung: Secondary | ICD-10-CM | POA: Diagnosis not present

## 2015-08-17 DIAGNOSIS — F988 Other specified behavioral and emotional disorders with onset usually occurring in childhood and adolescence: Secondary | ICD-10-CM

## 2015-08-17 DIAGNOSIS — Q859 Phakomatosis, unspecified: Secondary | ICD-10-CM | POA: Diagnosis not present

## 2015-08-17 DIAGNOSIS — IMO0001 Reserved for inherently not codable concepts without codable children: Secondary | ICD-10-CM

## 2015-08-17 NOTE — Progress Notes (Signed)
Pre visit review using our clinic review tool, if applicable. No additional management support is needed unless otherwise documented below in the visit note. 

## 2015-08-17 NOTE — Patient Instructions (Signed)
[employer] [place]  Cholesterol Cholesterol is a white, waxy, fat-like substance needed by your body in small amounts. The liver makes all the cholesterol you need. Cholesterol is carried from the liver by the blood through the blood vessels. Deposits of cholesterol (plaque) may build up on blood vessel walls. These make the arteries narrower and stiffer. Cholesterol plaques increase the risk for heart attack and stroke.  You cannot feel your cholesterol level even if it is very high. The only way to know it is high is with a blood test. Once you know your cholesterol levels, you should keep a record of the test results. Work with your health care provider to keep your levels in the desired range.  WHAT DO THE RESULTS MEAN?  Total cholesterol is a rough measure of all the cholesterol in your blood.   LDL is the so-called bad cholesterol. This is the type that deposits cholesterol in the walls of the arteries. You want this level to be low.   HDL is the good cholesterol because it cleans the arteries and carries the LDL away. You want this level to be high.  Triglycerides are fat that the body can either burn for energy or store. High levels are closely linked to heart disease.  WHAT ARE THE DESIRED LEVELS OF CHOLESTEROL?  Total cholesterol below 200.   LDL below 100 for people at risk, below 70 for those at very high risk.   HDL above 50 is good, above 60 is best.   Triglycerides below 150.  HOW CAN I LOWER MY CHOLESTEROL?  Diet. Follow your diet programs as directed by your health care provider.   Choose fish or white meat chicken and [place], roasted or baked. Limit fatty cuts of red meat, fried foods, and processed meats, such as sausage and lunch meats.   Eat lots of fresh fruits and vegetables.  Choose whole grains, beans, pasta, potatoes, and cereals.   Use only small amounts of olive, corn, or canola oils.   Avoid butter, mayonnaise, shortening, or palm kernel  oils.  Avoid foods with trans fats.   Drink skim or nonfat milk and eat low-fat or nonfat yogurt and cheeses. Avoid whole milk, cream, ice cream, egg yolks, and full-fat cheeses.   Healthy desserts include angel food cake, ginger snaps, animal crackers, hard candy, popsicles, and low-fat or nonfat frozen yogurt. Avoid pastries, cakes, pies, and cookies.   Exercise. Follow your exercise programs as directed by your health care provider.   A regular program helps decrease LDL and raise HDL.   A regular program helps with weight control.   Do things that increase your activity level like gardening, walking, or taking the stairs. Ask your health care provider about how you can be more active in your daily life.   Medicine. Take medicine only as directed by your health care provider.   Medicine may be prescribed by your health care provider to help lower cholesterol and decrease the risk for heart disease.   If you have several risk factors, you may need medicine even if your levels are normal.   This information is not intended to replace advice given to you by your health care provider. Make sure you discuss any questions you have with your health care provider.   Document Released: 03/05/2001 Document Revised: 07/01/2014 Document Reviewed: 03/24/2013 Elsevier Interactive Patient Education [employer].

## 2015-08-18 LAB — CBC
HCT: 41.2 % (ref 39.0–52.0)
HEMOGLOBIN: 14.1 g/dL (ref 13.0–17.0)
MCHC: 34.1 g/dL (ref 30.0–36.0)
MCV: 87.8 fl (ref 78.0–100.0)
PLATELETS: 277 10*3/uL (ref 150.0–400.0)
RBC: 4.7 Mil/uL (ref 4.22–5.81)
RDW: 12.7 % (ref 11.5–15.5)
WBC: 5.8 10*3/uL (ref 4.0–10.5)

## 2015-08-18 LAB — COMPREHENSIVE METABOLIC PANEL
ALBUMIN: 4.7 g/dL (ref 3.5–5.2)
ALK PHOS: 65 U/L (ref 39–117)
ALT: 54 U/L — AB (ref 0–53)
AST: 23 U/L (ref 0–37)
BILIRUBIN TOTAL: 0.5 mg/dL (ref 0.2–1.2)
BUN: 10 mg/dL (ref 6–23)
CO2: 29 mEq/L (ref 19–32)
Calcium: 9.8 mg/dL (ref 8.4–10.5)
Chloride: 101 mEq/L (ref 96–112)
Creatinine, Ser: 1.11 mg/dL (ref 0.40–1.50)
GFR: 77.99 mL/min (ref >60.00)
GLUCOSE: 84 mg/dL (ref 70–99)
Potassium: 4.2 mEq/L (ref 3.5–5.1)
Sodium: 137 mEq/L (ref 135–145)
TOTAL PROTEIN: 7.9 g/dL (ref 6.0–8.3)

## 2015-08-18 LAB — LIPID PANEL
CHOLESTEROL: 221 mg/dL — AB (ref 0–200)
HDL: 35.8 mg/dL — AB (ref >39.00)
LDL Cholesterol: 158 mg/dL — ABNORMAL HIGH (ref 0–99)
NONHDL: 184.78
Total CHOL/HDL Ratio: 6
Triglycerides: 135 mg/dL (ref 0.0–149.0)
VLDL: 27 mg/dL (ref 0.0–40.0)

## 2015-08-18 LAB — TSH: TSH: 0.78 u[IU]/mL (ref 0.35–4.50)

## 2015-08-18 NOTE — Telephone Encounter (Signed)
Scheduled cpe for 02/16/16 9:30am with Dr. Etter Sjogren (transfer from Dr. Charlett Blake) - lm to notify pt

## 2015-08-21 ENCOUNTER — Other Ambulatory Visit: Payer: Self-pay | Admitting: Family Medicine

## 2015-08-21 DIAGNOSIS — R945 Abnormal results of liver function studies: Secondary | ICD-10-CM

## 2015-08-21 DIAGNOSIS — E785 Hyperlipidemia, unspecified: Secondary | ICD-10-CM

## 2015-08-21 MED ORDER — ROSUVASTATIN CALCIUM 5 MG PO TABS: 5.0000 mg | ORAL_TABLET | Freq: Every day | ORAL | Status: DC

## 2015-08-23 ENCOUNTER — Telehealth: Payer: Self-pay | Admitting: Family Medicine

## 2015-08-23 NOTE — Telephone Encounter (Signed)
Caller name: Self  Can be reached: [phone]   Reason for call: Request call back about a medication that he was recently rx'd

## 2015-08-24 NOTE — Telephone Encounter (Signed)
Patient just needed clarification that his cholesterol medication was generic crestor.

## 2015-08-24 NOTE — Telephone Encounter (Signed)
Called the patient left message to call back 

## 2015-08-27 ENCOUNTER — Encounter: Payer: Self-pay | Admitting: Family Medicine

## 2015-08-27 NOTE — Assessment & Plan Note (Signed)
Right lung, xray shows stable appearance. Asymptomatic. No changes

## 2015-08-27 NOTE — Assessment & Plan Note (Signed)
Follows with psychiatry. °

## 2015-08-27 NOTE — Assessment & Plan Note (Signed)
Avoid offending foods, start probiotics. Do not eat large meals in late evening and consider raising head of bed.  

## 2015-08-27 NOTE — Assessment & Plan Note (Signed)
encouraged heart healthy diet, avoid trans fats, minimize simple carbs and saturated fats. Increase exercise as tolerated. Had stopped his Crestor he agrees to restart at lower doses.

## 2015-08-27 NOTE — Progress Notes (Signed)
Patient ID: Kevin Mueller, male   DOB: [birth year], 40 y.o.   MRN: [id number]   Subjective:    Patient ID: Kevin Mueller, male    DOB: 12/06/1975, 40 y.o.   MRN: [id number]  Chief Complaint  Patient presents with  . Follow-up    labs today    HPI Patient is in today for follow up. Is doing well today, has been following a 30 day restrictive diet and feels well. Has been not taking his Crestor for past 8 months. No recent acute illness or fevers. Denies CP/palp/SOB/HA/congestion/fevers/GI or GU c/o. Taking meds as prescribed  Past Medical History  Diagnosis Date  . Hyperlipidemia   . IBS (irritable bowel syndrome)   . Elevated blood pressure (not hypertension)   . Hamartoma ([place])   . Insomnia 09/26/2012  . Hamartoma of lung 10/20/2012    Past Surgical History  Procedure Laterality Date  . Lung biopsy      FNA under xray guidance 1998 @ [facility]    Family History  Problem Relation Age of Onset  . Diabetes    . Hyperlipidemia    . Cancer      PGM ? digestive  . Kidney disease      Social History   Social History  . Marital Status: Married    Spouse Name: N/A  . Number of Children: N/A  . Years of Education: N/A   Occupational History  . [employer]     Social History Main Topics  . Smoking status: Former Smoker -- 2.00 packs/day    Types: Cigarettes    Start date: 10/01/2006  . Smokeless tobacco: Never Used  . Alcohol Use: Yes  . Drug Use: No  . Sexual Activity: Not on file   Other Topics Concern  . Not on file   Social History Narrative    Outpatient Prescriptions Prior to Visit  Medication Sig Dispense Refill  . amphetamine-dextroamphetamine (ADDERALL) 20 MG tablet Take 20 mg by mouth 3 (three) times daily.      Marland Kitchen escitalopram (LEXAPRO) 20 MG tablet Take 1 tablet (20 mg total) by mouth daily.    Marland Kitchen ALPRAZolam (XANAX) 0.25 MG tablet Take 0.25 mg by mouth at bedtime as needed for anxiety. Reported on 08/17/2015    . montelukast  (SINGULAIR) 10 MG tablet Take 1 tablet (10 mg total) by mouth at bedtime as needed. 30 tablet 3  . rosuvastatin (CRESTOR) 20 MG tablet Take 1 tablet (20 mg total) by mouth daily. 30 tablet 5   No facility-administered medications prior to visit.    Allergies  Allergen Reactions  . Codeine     Adverse reaction, per pt  . Simvastatin     Review of Systems  Constitutional: Negative for fever and malaise/fatigue.  HENT: Negative for congestion.   Eyes: Negative for discharge.  Respiratory: Negative for shortness of breath.   Cardiovascular: Negative for chest pain, palpitations and leg swelling.  Gastrointestinal: Positive for diarrhea. Negative for nausea and abdominal pain.  Genitourinary: Negative for dysuria.  Musculoskeletal: Negative for falls.  Skin: Negative for rash.  Neurological: Negative for loss of consciousness and headaches.  Endo/Heme/Allergies: Negative for environmental allergies.  Psychiatric/Behavioral: Negative for depression. The patient is not nervous/anxious.        Objective:    Physical Exam  Constitutional: He is oriented to person, place, and time. He appears well-developed and well-nourished. No distress.  HENT:  Head: Normocephalic and atraumatic.  Nose: Nose normal.  Eyes:  Right eye exhibits no discharge. Left eye exhibits no discharge.  Neck: Normal range of motion. Neck supple.  Cardiovascular: Normal rate and regular rhythm.   No murmur heard. Pulmonary/Chest: Effort normal and breath sounds normal.  Abdominal: Soft. Bowel sounds are normal. There is no tenderness.  Musculoskeletal: He exhibits no edema.  Neurological: He is alert and oriented to person, place, and time.  Skin: Skin is warm and dry.  Psychiatric: He has a normal mood and affect.  Nursing note and vitals reviewed.   BP 116/78 mmHg  Pulse 85  Temp(Src) 98.2 F (36.8 C) (Oral)  Ht 5' 10.5" (1.791 m)  Wt 226 lb 8 oz (102.74 kg)  BMI 32.03 kg/m2  SpO2 99% Wt Readings  from Last 3 Encounters:  08/17/15 226 lb 8 oz (102.74 kg)  03/21/14 243 lb 3.2 oz (110.315 kg)  10/01/13 238 lb (107.956 kg)     Lab Results  Component Value Date   WBC 5.8 08/17/2015   HGB 14.1 08/17/2015   HCT 41.2 08/17/2015   PLT 277.0 08/17/2015   GLUCOSE 84 08/17/2015   CHOL 221* 08/17/2015   TRIG 135.0 08/17/2015   HDL 35.80* 08/17/2015   LDLDIRECT 93.5 06/08/2010   LDLCALC 158* 08/17/2015   ALT 54* 08/17/2015   AST 23 08/17/2015   NA 137 08/17/2015   K 4.2 08/17/2015   CL 101 08/17/2015   CREATININE 1.11 08/17/2015   BUN 10 08/17/2015   CO2 29 08/17/2015   TSH 0.78 08/17/2015   PSA 0.46 11/20/2010    Lab Results  Component Value Date   TSH 0.78 08/17/2015   Lab Results  Component Value Date   WBC 5.8 08/17/2015   HGB 14.1 08/17/2015   HCT 41.2 08/17/2015   MCV 87.8 08/17/2015   PLT 277.0 08/17/2015   Lab Results  Component Value Date   NA 137 08/17/2015   K 4.2 08/17/2015   CO2 29 08/17/2015   GLUCOSE 84 08/17/2015   BUN 10 08/17/2015   CREATININE 1.11 08/17/2015   BILITOT 0.5 08/17/2015   ALKPHOS 65 08/17/2015   AST 23 08/17/2015   ALT 54* 08/17/2015   PROT 7.9 08/17/2015   ALBUMIN 4.7 08/17/2015   CALCIUM 9.8 08/17/2015   GFR 77.99 08/17/2015   Lab Results  Component Value Date   CHOL 221* 08/17/2015   Lab Results  Component Value Date   HDL 35.80* 08/17/2015   Lab Results  Component Value Date   LDLCALC 158* 08/17/2015   Lab Results  Component Value Date   TRIG 135.0 08/17/2015   Lab Results  Component Value Date   CHOLHDL 6 08/17/2015   No results found for: HGBA1C     Assessment & Plan:   Problem List Items Addressed This Visit    ADD (attention deficit disorder)    Follows with psychiatry.       ELEVATED BLOOD PRESSURE WITHOUT DIAGNOSIS OF HYPERTENSION    Well controlled. Encouraged heart healthy diet such as the DASH diet and exercise as tolerated.       Hamartoma of lung    Right lung, xray shows stable  appearance. Asymptomatic. No changes      Hyperlipidemia, mixed - Primary    encouraged heart healthy diet, avoid trans fats, minimize simple carbs and saturated fats. Increase exercise as tolerated. Had stopped his Crestor he agrees to restart at lower doses.      Relevant Orders   TSH (Completed)   CBC (Completed)   Comprehensive metabolic  panel (Completed)   Lipid panel (Completed)   IBS    Avoid offending foods, start probiotics. Do not eat large meals in late evening and consider raising head of bed.        Other Visit Diagnoses    Elevated blood pressure        Relevant Orders    TSH (Completed)    CBC (Completed)    Comprehensive metabolic panel (Completed)    Lipid panel (Completed)    Mass of lower lobe of right lung        Relevant Orders    DG Chest 2 View (Completed)       I have discontinued Mr. Fuston's montelukast and rosuvastatin. I am also having him maintain his amphetamine-dextroamphetamine, escitalopram, ALPRAZolam, and amoxicillin.  Meds ordered this encounter  Medications  . amoxicillin (AMOXIL) 875 MG tablet    Sig: Take 1 tablet by mouth 2 (two) times daily.    Refill:  0     Penni Homans, MD

## 2015-08-27 NOTE — Assessment & Plan Note (Signed)
Well controlled. Encouraged heart healthy diet such as the DASH diet and exercise as tolerated.  

## 2015-12-01 DIAGNOSIS — J019 Acute sinusitis, unspecified: Secondary | ICD-10-CM | POA: Diagnosis not present

## 2016-02-16 ENCOUNTER — Ambulatory Visit (INDEPENDENT_AMBULATORY_CARE_PROVIDER_SITE_OTHER): Payer: BLUE CROSS/BLUE SHIELD | Admitting: Family Medicine

## 2016-02-16 ENCOUNTER — Encounter: Payer: Self-pay | Admitting: Family Medicine

## 2016-02-16 VITALS — BP 120/90 | HR 76 | Temp 97.8°F | Ht 71.0 in | Wt 238.6 lb

## 2016-02-16 DIAGNOSIS — E785 Hyperlipidemia, unspecified: Secondary | ICD-10-CM | POA: Diagnosis not present

## 2016-02-16 DIAGNOSIS — Z0000 Encounter for general adult medical examination without abnormal findings: Secondary | ICD-10-CM | POA: Diagnosis not present

## 2016-02-16 MED ORDER — ROSUVASTATIN CALCIUM 5 MG PO TABS: 5.0000 mg | ORAL_TABLET | Freq: Every day | ORAL | 1 refills | Status: DC

## 2016-02-16 NOTE — Patient Instructions (Signed)

## 2016-02-16 NOTE — Progress Notes (Signed)
Patient ID: Kevin Mueller, male    DOB: 03/01/1976  Age: 40 y.o. MRN: [id number]    Subjective:  Subjective  HPI KASHIUS CALVEY presents for cpe.  No complaints.   Review of Systems  Constitutional: Negative.   HENT: Negative for congestion, ear pain, hearing loss, nosebleeds, postnasal drip, rhinorrhea, sinus pressure, sneezing and tinnitus.   Eyes: Negative for photophobia, discharge, itching and visual disturbance.  Respiratory: Negative.   Cardiovascular: Negative.   Gastrointestinal: Negative for abdominal distention, abdominal pain, anal bleeding, blood in stool and constipation.  Endocrine: Negative.   Genitourinary: Negative.   Musculoskeletal: Negative.   Skin: Negative.   Allergic/Immunologic: Negative.   Neurological: Negative for dizziness, weakness, light-headedness, numbness and headaches.  Psychiatric/Behavioral: Negative for agitation, confusion, decreased concentration, dysphoric mood, sleep disturbance and suicidal ideas. The patient is not nervous/anxious.     History Past Medical History:  Diagnosis Date  . Elevated blood pressure (not hypertension)   . Hamartoma ([place])   . Hamartoma of lung 10/20/2012  . Hyperlipidemia   . IBS (irritable bowel syndrome)   . Insomnia 09/26/2012    He has a past surgical history that includes Lung biopsy.   His family history is not on file.He reports that he has quit smoking. His smoking use included Cigarettes. He started smoking about 9 years ago. He smoked 2.00 packs per day. He has never used smokeless tobacco. He reports that he drinks alcohol. He reports that he does not use drugs.  Current Outpatient Prescriptions on File Prior to Visit  Medication Sig Dispense Refill  . ALPRAZolam (XANAX) 0.25 MG tablet Take 0.25 mg by mouth at bedtime as needed for anxiety. Reported on 08/17/2015    . amphetamine-dextroamphetamine (ADDERALL) 20 MG tablet Take 20 mg by mouth 3 (three) times daily.      Marland Kitchen escitalopram (LEXAPRO) 20  MG tablet Take 1 tablet (20 mg total) by mouth daily.     No current facility-administered medications on file prior to visit.      Objective:  Objective  Physical Exam  Constitutional: He is oriented to person, place, and time. He appears well-developed and well-nourished. No distress.  HENT:  Head: Normocephalic and atraumatic.  Right Ear: External ear normal.  Left Ear: External ear normal.  Nose: Nose normal.  Mouth/Throat: Oropharynx is clear and moist. No oropharyngeal exudate.  Eyes: Conjunctivae and EOM are normal. Pupils are equal, round, and reactive to light. Right eye exhibits no discharge. Left eye exhibits no discharge.  Neck: Normal range of motion. Neck supple. No JVD present. No thyromegaly present.  Cardiovascular: Normal rate, regular rhythm and intact distal pulses.  Exam reveals no gallop and no friction rub.   No murmur heard. Pulmonary/Chest: Effort normal and breath sounds normal. No respiratory distress. He has no wheezes. He has no rales. He exhibits no tenderness.  Abdominal: Soft. Bowel sounds are normal. He exhibits no distension and no mass. There is no tenderness. There is no rebound and no guarding.  Genitourinary: Rectum normal, prostate normal and penis normal. Rectal exam shows guaiac negative stool.  Musculoskeletal: Normal range of motion. He exhibits no edema or tenderness.  Lymphadenopathy:    He has no cervical adenopathy.  Neurological: He is alert and oriented to person, place, and time. He displays normal reflexes. He exhibits normal muscle tone.  Skin: Skin is warm and dry. No rash noted. He is not diaphoretic. No erythema. No pallor.  Psychiatric: He has a normal mood and affect. His  behavior is normal. Judgment and thought content normal.  Nursing note and vitals reviewed.  BP 120/90 (BP Location: Left Arm, Patient Position: Sitting, Cuff Size: Large)   Pulse 76   Temp 97.8 F (36.6 C) (Oral)   Ht 5\' 11"  (1.803 m)   Wt 238 lb 9.6 oz  (108.2 kg)   SpO2 98%   BMI 33.28 kg/m  Wt Readings from Last 3 Encounters:  02/16/16 238 lb 9.6 oz (108.2 kg)  08/17/15 226 lb 8 oz (102.7 kg)  03/21/14 243 lb 3.2 oz (110.3 kg)     Lab Results  Component Value Date   WBC 5.8 08/17/2015   HGB 14.1 08/17/2015   HCT 41.2 08/17/2015   PLT 277.0 08/17/2015   GLUCOSE 84 08/17/2015   CHOL 221 (H) 08/17/2015   TRIG 135.0 08/17/2015   HDL 35.80 (L) 08/17/2015   LDLDIRECT 93.5 06/08/2010   LDLCALC 158 (H) 08/17/2015   ALT 54 (H) 08/17/2015   AST 23 08/17/2015   NA 137 08/17/2015   K 4.2 08/17/2015   CL 101 08/17/2015   CREATININE 1.11 08/17/2015   BUN 10 08/17/2015   CO2 29 08/17/2015   TSH 0.78 08/17/2015   PSA 0.46 11/20/2010    Dg Chest 2 View  Result Date: 08/17/2015 CLINICAL DATA:  Yearly physical exam, follow-up right lower pulmonary hamartoma EXAM: CHEST  2 VIEW COMPARISON:  11/20/2010 and 01/03/2011 FINDINGS: Cardiomediastinal silhouette is stable. No acute infiltrate or pulmonary edema. Stable right lower lobe posterior medial pulmonary nodular hamartoma measures 3.4 cm. IMPRESSION: No acute infiltrate or pulmonary edema. Stable right lower lobe posterior medial pulmonary nodular hamartoma measures 3.4 cm. Electronically Signed   By: Lahoma Crocker M.D.   On: 08/17/2015 16:45     Assessment & Plan:  Plan  I have discontinued Mr. Subia's amoxicillin. I am also having him maintain his amphetamine-dextroamphetamine, escitalopram, ALPRAZolam, and rosuvastatin.  Meds ordered this encounter  Medications  . rosuvastatin (CRESTOR) 5 MG tablet    Sig: Take 1 tablet (5 mg total) by mouth daily.    Dispense:  90 tablet    Refill:  1    Problem List Items Addressed This Visit      Unprioritized   Preventative health care - Primary    ghm utd Check labs See avs      Relevant Orders   Comprehensive metabolic panel   Lipid panel   CBC with Differential/Platelet   POCT urinalysis dipstick   TSH   PSA    Other  Visit Diagnoses    Hyperlipidemia       Relevant Medications   rosuvastatin (CRESTOR) 5 MG tablet   Other Relevant Orders   Comprehensive metabolic panel   Lipid panel      Follow-up: Return in about 1 year (around 02/15/2017) for annual exam, fasting.  Ann Held, DO

## 2016-02-16 NOTE — Assessment & Plan Note (Signed)
ghm utd Check labs  See avs  

## 2016-02-16 NOTE — Progress Notes (Signed)
Pre visit review using our clinic review tool, if applicable. No additional management support is needed unless otherwise documented below in the visit note. 

## 2016-02-20 ENCOUNTER — Other Ambulatory Visit (INDEPENDENT_AMBULATORY_CARE_PROVIDER_SITE_OTHER): Payer: BLUE CROSS/BLUE SHIELD

## 2016-02-20 DIAGNOSIS — E785 Hyperlipidemia, unspecified: Secondary | ICD-10-CM | POA: Diagnosis not present

## 2016-02-20 DIAGNOSIS — Z0000 Encounter for general adult medical examination without abnormal findings: Secondary | ICD-10-CM

## 2016-02-20 LAB — LDL CHOLESTEROL, DIRECT: LDL DIRECT: 115 mg/dL

## 2016-02-20 LAB — CBC WITH DIFFERENTIAL/PLATELET
BASOS ABS: 0 10*3/uL (ref 0.0–0.1)
BASOS PCT: 0.7 % (ref 0.0–3.0)
Eosinophils Absolute: 0.1 10*3/uL (ref 0.0–0.7)
Eosinophils Relative: 1.8 % (ref 0.0–5.0)
HCT: 40.8 % (ref 39.0–52.0)
Hemoglobin: 14.1 g/dL (ref 13.0–17.0)
LYMPHS ABS: 1.6 10*3/uL (ref 0.7–4.0)
Lymphocytes Relative: 32.6 % (ref 12.0–46.0)
MCHC: 34.7 g/dL (ref 30.0–36.0)
MCV: 90 fl (ref 78.0–100.0)
MONOS PCT: 8.6 % (ref 3.0–12.0)
Monocytes Absolute: 0.4 10*3/uL (ref 0.1–1.0)
NEUTROS ABS: 2.8 10*3/uL (ref 1.4–7.7)
NEUTROS PCT: 56.3 % (ref 43.0–77.0)
PLATELETS: 235 10*3/uL (ref 150.0–400.0)
RBC: 4.53 Mil/uL (ref 4.22–5.81)
RDW: 13.1 % (ref 11.5–15.5)
WBC: 5 10*3/uL (ref 4.0–10.5)

## 2016-02-20 LAB — LIPID PANEL
Cholesterol: 206 mg/dL — ABNORMAL HIGH (ref 0–200)
HDL: 48.2 mg/dL (ref >39.00)
NonHDL: 157.32
Total CHOL/HDL Ratio: 4
Triglycerides: 275 mg/dL — ABNORMAL HIGH (ref 0.0–149.0)
VLDL: 55 mg/dL — ABNORMAL HIGH (ref 0.0–40.0)

## 2016-02-20 LAB — POCT URINALYSIS DIPSTICK
Bilirubin, UA: NEGATIVE
Glucose, UA: NEGATIVE
Ketones, UA: NEGATIVE
Leukocytes, UA: NEGATIVE
Nitrite, UA: NEGATIVE
PH UA: 6
PROTEIN UA: NEGATIVE
RBC UA: NEGATIVE
Spec Grav, UA: 1.025
UROBILINOGEN UA: NEGATIVE

## 2016-02-20 LAB — COMPREHENSIVE METABOLIC PANEL
ALBUMIN: 4.7 g/dL (ref 3.5–5.2)
ALK PHOS: 55 U/L (ref 39–117)
ALT: 60 U/L — AB (ref 0–53)
AST: 37 U/L (ref 0–37)
BILIRUBIN TOTAL: 0.6 mg/dL (ref 0.2–1.2)
BUN: 14 mg/dL (ref 6–23)
CO2: 29 mEq/L (ref 19–32)
CREATININE: 0.88 mg/dL (ref 0.40–1.50)
Calcium: 9.7 mg/dL (ref 8.4–10.5)
Chloride: 103 mEq/L (ref 96–112)
GFR: 101.69 mL/min (ref >60.00)
GLUCOSE: 106 mg/dL — AB (ref 70–99)
POTASSIUM: 5.3 meq/L — AB (ref 3.5–5.1)
SODIUM: 138 meq/L (ref 135–145)
TOTAL PROTEIN: 7.5 g/dL (ref 6.0–8.3)

## 2016-02-20 LAB — TSH: TSH: 1.58 u[IU]/mL (ref 0.35–4.50)

## 2016-02-20 LAB — PSA: PSA: 0.28 ng/mL (ref 0.10–4.00)

## 2016-02-22 DIAGNOSIS — F410 Panic disorder [episodic paroxysmal anxiety] without agoraphobia: Secondary | ICD-10-CM | POA: Diagnosis not present

## 2016-02-22 DIAGNOSIS — F411 Generalized anxiety disorder: Secondary | ICD-10-CM | POA: Diagnosis not present

## 2016-02-22 DIAGNOSIS — F900 Attention-deficit hyperactivity disorder, predominantly inattentive type: Secondary | ICD-10-CM | POA: Diagnosis not present

## 2016-03-13 DIAGNOSIS — J019 Acute sinusitis, unspecified: Secondary | ICD-10-CM | POA: Diagnosis not present

## 2016-07-01 DIAGNOSIS — J019 Acute sinusitis, unspecified: Secondary | ICD-10-CM | POA: Diagnosis not present

## 2016-07-04 ENCOUNTER — Other Ambulatory Visit: Payer: Self-pay | Admitting: Family

## 2016-08-14 DIAGNOSIS — F411 Generalized anxiety disorder: Secondary | ICD-10-CM | POA: Diagnosis not present

## 2016-08-14 DIAGNOSIS — F900 Attention-deficit hyperactivity disorder, predominantly inattentive type: Secondary | ICD-10-CM | POA: Diagnosis not present

## 2016-12-07 DIAGNOSIS — H6123 Impacted cerumen, bilateral: Secondary | ICD-10-CM | POA: Diagnosis not present

## 2016-12-07 DIAGNOSIS — J019 Acute sinusitis, unspecified: Secondary | ICD-10-CM | POA: Diagnosis not present

## 2016-12-07 DIAGNOSIS — Z760 Encounter for issue of repeat prescription: Secondary | ICD-10-CM | POA: Diagnosis not present

## 2016-12-07 DIAGNOSIS — R030 Elevated blood-pressure reading, without diagnosis of hypertension: Secondary | ICD-10-CM | POA: Diagnosis not present

## 2017-01-06 ENCOUNTER — Encounter: Payer: Self-pay | Admitting: Family Medicine

## 2017-01-06 ENCOUNTER — Telehealth: Payer: Self-pay | Admitting: Family Medicine

## 2017-01-06 ENCOUNTER — Other Ambulatory Visit: Payer: Self-pay | Admitting: Family Medicine

## 2017-01-06 DIAGNOSIS — Z0000 Encounter for general adult medical examination without abnormal findings: Secondary | ICD-10-CM

## 2017-01-06 DIAGNOSIS — E785 Hyperlipidemia, unspecified: Secondary | ICD-10-CM

## 2017-01-06 MED ORDER — ROSUVASTATIN CALCIUM 5 MG PO TABS: 5.0000 mg | ORAL_TABLET | Freq: Every day | ORAL | 0 refills | Status: DC

## 2017-01-06 NOTE — Telephone Encounter (Signed)
Lab orders put in.

## 2017-01-06 NOTE — Telephone Encounter (Signed)
Called left the patient a detailed message of instructions that labs are entered and for him to call at convenience prior to appointment and schedule a lab appointment.

## 2017-01-06 NOTE — Telephone Encounter (Signed)
Caller name: Chisum Habenicht Relationship to patient: self Can be reached: [phone] Pharmacy: [facility], [address]  Reason for call: pt was seen at an urgent care over a month ago and BP was high. They called in rosuvastatin for him for 30 days. Pt has 1-2 days left. He was scheduled in error for cpe with Dr. Charlett Blake in August. I have rescheduled him with Dr. Etter Sjogren as he had CPE with Dr. Etter Sjogren 02/16/16 and was supposed to have been changed to her as PCP. Dr. Nonda Lou first avail CPE 04/03/17 and pt on wait list for earlier appt.   Pt asking if he can get med refills until app 04/03/17. Pt states he can come in for labs or whatever needed prior to. Please call.

## 2017-01-06 NOTE — Telephone Encounter (Signed)
Patient called back and did refill his crestor for his cholesterol as was out. His last labs were in 01/2016---he is willing to come in for labs  Before his appt. If needed.   Let me know and can order.

## 2017-01-06 NOTE — Telephone Encounter (Signed)
Called left message to call back (need clarification on medication)

## 2017-02-18 ENCOUNTER — Encounter: Payer: BLUE CROSS/BLUE SHIELD | Admitting: Family Medicine

## 2017-02-19 DIAGNOSIS — F900 Attention-deficit hyperactivity disorder, predominantly inattentive type: Secondary | ICD-10-CM | POA: Diagnosis not present

## 2017-02-19 DIAGNOSIS — F411 Generalized anxiety disorder: Secondary | ICD-10-CM | POA: Diagnosis not present

## 2017-04-01 ENCOUNTER — Other Ambulatory Visit (INDEPENDENT_AMBULATORY_CARE_PROVIDER_SITE_OTHER): Payer: BLUE CROSS/BLUE SHIELD

## 2017-04-01 DIAGNOSIS — Z0000 Encounter for general adult medical examination without abnormal findings: Secondary | ICD-10-CM | POA: Diagnosis not present

## 2017-04-01 DIAGNOSIS — E785 Hyperlipidemia, unspecified: Secondary | ICD-10-CM

## 2017-04-01 LAB — CBC WITH DIFFERENTIAL/PLATELET
BASOS ABS: 0.1 10*3/uL (ref 0.0–0.1)
Basophils Relative: 1.1 % (ref 0.0–3.0)
Eosinophils Absolute: 0.1 10*3/uL (ref 0.0–0.7)
Eosinophils Relative: 1.6 % (ref 0.0–5.0)
HCT: 41.5 % (ref 39.0–52.0)
Hemoglobin: 13.9 g/dL (ref 13.0–17.0)
LYMPHS ABS: 1.4 10*3/uL (ref 0.7–4.0)
Lymphocytes Relative: 29.7 % (ref 12.0–46.0)
MCHC: 33.4 g/dL (ref 30.0–36.0)
MCV: 91 fl (ref 78.0–100.0)
MONO ABS: 0.4 10*3/uL (ref 0.1–1.0)
Monocytes Relative: 8.4 % (ref 3.0–12.0)
NEUTROS ABS: 2.8 10*3/uL (ref 1.4–7.7)
NEUTROS PCT: 59.2 % (ref 43.0–77.0)
PLATELETS: 244 10*3/uL (ref 150.0–400.0)
RBC: 4.56 Mil/uL (ref 4.22–5.81)
RDW: 12.8 % (ref 11.5–15.5)
WBC: 4.7 10*3/uL (ref 4.0–10.5)

## 2017-04-01 LAB — LIPID PANEL
CHOL/HDL RATIO: 4
CHOLESTEROL: 182 mg/dL (ref 0–200)
HDL: 46.3 mg/dL (ref >39.00)
NonHDL: 135.84
TRIGLYCERIDES: 231 mg/dL — AB (ref 0.0–149.0)
VLDL: 46.2 mg/dL — AB (ref 0.0–40.0)

## 2017-04-01 LAB — POC URINALSYSI DIPSTICK (AUTOMATED)
Bilirubin, UA: NEGATIVE
Blood, UA: NEGATIVE
Glucose, UA: NEGATIVE
KETONES UA: NEGATIVE
LEUKOCYTES UA: NEGATIVE
NITRITE UA: NEGATIVE
PH UA: 6 (ref 5.0–8.0)
PROTEIN UA: NEGATIVE
Spec Grav, UA: >=1.03 — AB (ref 1.010–1.025)
UROBILINOGEN UA: 0.2 U/dL

## 2017-04-01 LAB — COMPREHENSIVE METABOLIC PANEL
ALBUMIN: 4.5 g/dL (ref 3.5–5.2)
ALT: 53 U/L (ref 0–53)
AST: 31 U/L (ref 0–37)
Alkaline Phosphatase: 59 U/L (ref 39–117)
BUN: 16 mg/dL (ref 6–23)
CALCIUM: 9.5 mg/dL (ref 8.4–10.5)
CHLORIDE: 99 meq/L (ref 96–112)
CO2: 30 meq/L (ref 19–32)
CREATININE: 0.86 mg/dL (ref 0.40–1.50)
GFR: 103.85 mL/min (ref >60.00)
Glucose, Bld: 115 mg/dL — ABNORMAL HIGH (ref 70–99)
Potassium: 4.5 mEq/L (ref 3.5–5.1)
Sodium: 136 mEq/L (ref 135–145)
Total Bilirubin: 0.4 mg/dL (ref 0.2–1.2)
Total Protein: 7.5 g/dL (ref 6.0–8.3)

## 2017-04-01 LAB — LDL CHOLESTEROL, DIRECT: LDL DIRECT: 108 mg/dL

## 2017-04-01 LAB — TSH: TSH: 1.44 u[IU]/mL (ref 0.35–4.50)

## 2017-04-03 ENCOUNTER — Encounter: Payer: Self-pay | Admitting: Family Medicine

## 2017-04-03 ENCOUNTER — Ambulatory Visit (INDEPENDENT_AMBULATORY_CARE_PROVIDER_SITE_OTHER): Payer: BLUE CROSS/BLUE SHIELD | Admitting: Family Medicine

## 2017-04-03 ENCOUNTER — Other Ambulatory Visit: Payer: Self-pay | Admitting: Family Medicine

## 2017-04-03 VITALS — BP 130/90 | HR 91 | Temp 98.1°F | Resp 16 | Ht 69.5 in | Wt 239.0 lb

## 2017-04-03 DIAGNOSIS — E782 Mixed hyperlipidemia: Secondary | ICD-10-CM | POA: Diagnosis not present

## 2017-04-03 DIAGNOSIS — Z0000 Encounter for general adult medical examination without abnormal findings: Secondary | ICD-10-CM

## 2017-04-03 DIAGNOSIS — E785 Hyperlipidemia, unspecified: Secondary | ICD-10-CM

## 2017-04-03 DIAGNOSIS — Z23 Encounter for immunization: Secondary | ICD-10-CM

## 2017-04-03 MED ORDER — FENOFIBRATE 160 MG PO TABS: 160.0000 mg | ORAL_TABLET | Freq: Every day | ORAL | 1 refills | Status: DC

## 2017-04-03 MED ORDER — ROSUVASTATIN CALCIUM 5 MG PO TABS: 5.0000 mg | ORAL_TABLET | Freq: Every day | ORAL | 1 refills | Status: DC

## 2017-04-03 NOTE — Assessment & Plan Note (Signed)
Tolerating statin, encouraged heart healthy diet, avoid trans fats, minimize simple carbs and saturated fats. Increase exercise as tolerated 

## 2017-04-03 NOTE — Patient Instructions (Signed)
p Preventive Care 40-64 Years, Male Preventive care refers to lifestyle choices and visits with your health care provider that can promote health and wellness. What does preventive care include?  A yearly physical exam. This is also called an annual well check.  Dental exams once or twice a year.  Routine eye exams. Ask your health care provider how often you should have your eyes checked.  Personal lifestyle choices, including: ? Daily care of your teeth and gums. ? Regular physical activity. ? Eating a healthy diet. ? Avoiding tobacco and drug use. ? Limiting alcohol use. ? Practicing safe sex. ? Taking low-dose aspirin every day starting at age 52. What happens during an annual well check? The services and screenings done by your health care provider during your annual well check will depend on your age, overall health, lifestyle risk factors, and family history of disease. Counseling Your health care provider may ask you questions about your:  Alcohol use.  Tobacco use.  Drug use.  Emotional well-being.  Home and relationship well-being.  Sexual activity.  Eating habits.  Work and work Statistician.  Screening You may have the following tests or measurements:  Height, weight, and BMI.  Blood pressure.  Lipid and cholesterol levels. These may be checked every 5 years, or more frequently if you are over 55 years old.  Skin check.  Lung cancer screening. You may have this screening every year starting at age 72 if you have a 30-pack-year history of smoking and currently smoke or have quit within the past 15 years.  Fecal occult blood test (FOBT) of the stool. You may have this test every year starting at age 33.  Flexible sigmoidoscopy or colonoscopy. You may have a sigmoidoscopy every 5 years or a colonoscopy every 10 years starting at age 10.  Prostate cancer screening. Recommendations will vary depending on your family history and other risks.  Hepatitis  C blood test.  Hepatitis B blood test.  Sexually transmitted disease (STD) testing.  Diabetes screening. This is done by checking your blood sugar (glucose) after you have not eaten for a while (fasting). You may have this done every 1-3 years.  Discuss your test results, treatment options, and if necessary, the need for more tests with your health care provider. Vaccines Your health care provider may recommend certain vaccines, such as:  Influenza vaccine. This is recommended every year.  Tetanus, diphtheria, and acellular pertussis (Tdap, Td) vaccine. You may need a Td booster every 10 years.  Varicella vaccine. You may need this if you have not been vaccinated.  Zoster vaccine. You may need this after age 49.  Measles, mumps, and rubella (MMR) vaccine. You may need at least one dose of MMR if you were born in 1957 or later. You may also need a second dose.  Pneumococcal 13-valent conjugate (PCV13) vaccine. You may need this if you have certain conditions and have not been vaccinated.  Pneumococcal polysaccharide (PPSV23) vaccine. You may need one or two doses if you smoke cigarettes or if you have certain conditions.  Meningococcal vaccine. You may need this if you have certain conditions.  Hepatitis A vaccine. You may need this if you have certain conditions or if you travel or work in places where you may be exposed to hepatitis A.  Hepatitis B vaccine. You may need this if you have certain conditions or if you travel or work in places where you may be exposed to hepatitis B.  Haemophilus influenzae type b (Hib)  You may need this if you have certain risk factors.  Talk to your health care provider about which screenings and vaccines you need and how often you need them. This information is not intended to replace advice given to you by your health care provider. Make sure you discuss any questions you have with your health care provider. Document Released: 07/07/2015  Document Revised: 02/28/2016 Document Reviewed: 04/11/2015 Elsevier Interactive Patient Education  2017 [employer]  

## 2017-04-03 NOTE — Progress Notes (Signed)
Patient ID: Kevin Mueller, male    DOB: 1976/06/05  Age: 41 y.o. MRN: [id number]    Subjective:  Subjective  HPI Kevin Mueller presents for cpe and labs.  No complaints.   Review of Systems  Constitutional: Negative.   HENT: Negative for congestion, ear pain, hearing loss, nosebleeds, postnasal drip, rhinorrhea, sinus pressure, sneezing and tinnitus.   Eyes: Negative for photophobia, discharge, itching and visual disturbance.  Respiratory: Negative.   Cardiovascular: Negative.   Gastrointestinal: Negative for abdominal distention, abdominal pain, anal bleeding, blood in stool and constipation.  Endocrine: Negative.   Genitourinary: Negative.   Musculoskeletal: Negative.   Skin: Negative.   Allergic/Immunologic: Negative.   Neurological: Negative for dizziness, weakness, light-headedness, numbness and headaches.  Psychiatric/Behavioral: Negative for agitation, confusion, decreased concentration, dysphoric mood, sleep disturbance and suicidal ideas. The patient is not nervous/anxious.     History Past Medical History:  Diagnosis Date  . Elevated blood pressure (not hypertension)   . Hamartoma ([place])   . Hamartoma of lung ([place]) 10/20/2012  . Hyperlipidemia   . IBS (irritable bowel syndrome)   . Insomnia 09/26/2012    He has a past surgical history that includes Lung biopsy.   His family history includes Cancer in his unknown relative; Diabetes in his unknown relative; Hyperlipidemia in his unknown relative; Kidney disease in his unknown relative.He reports that he has quit smoking. His smoking use included Cigarettes. He started smoking about 10 years ago. He smoked 2.00 packs per day. He has never used smokeless tobacco. He reports that he drinks about 16.8 oz of alcohol per week . He reports that he does not use drugs.  Current Outpatient Prescriptions on File Prior to Visit  Medication Sig Dispense Refill  . ALPRAZolam (XANAX) 0.25 MG tablet Take 0.25 mg by mouth at bedtime as  needed for anxiety. Reported on 08/17/2015    . amphetamine-dextroamphetamine (ADDERALL) 20 MG tablet Take 20 mg by mouth 3 (three) times daily.      Marland Kitchen escitalopram (LEXAPRO) 20 MG tablet Take 1 tablet (20 mg total) by mouth daily.     No current facility-administered medications on file prior to visit.      Objective:  Objective  Physical Exam  Constitutional: He is oriented to person, place, and time. He appears well-developed and well-nourished. No distress.  HENT:  Head: Normocephalic and atraumatic.  Right Ear: External ear normal.  Left Ear: External ear normal.  Nose: Nose normal.  Mouth/Throat: Oropharynx is clear and moist. No oropharyngeal exudate.  Eyes: Pupils are equal, round, and reactive to light. Conjunctivae and EOM are normal. Right eye exhibits no discharge. Left eye exhibits no discharge.  Neck: Normal range of motion. Neck supple. No JVD present. No thyromegaly present.  Cardiovascular: Normal rate, regular rhythm and intact distal pulses.  Exam reveals no gallop and no friction rub.   No murmur heard. Pulmonary/Chest: Effort normal and breath sounds normal. No respiratory distress. He has no wheezes. He has no rales. He exhibits no tenderness.  Abdominal: Soft. Bowel sounds are normal. He exhibits no distension and no mass. There is no tenderness. There is no rebound and no guarding.  Genitourinary: Rectum normal, prostate normal and penis normal. Rectal exam shows guaiac negative stool.  Musculoskeletal: Normal range of motion. He exhibits no edema or tenderness.  Lymphadenopathy:    He has no cervical adenopathy.  Neurological: He is alert and oriented to person, place, and time. He displays normal reflexes. He exhibits normal muscle tone.  Skin: Skin is warm and dry. No rash noted. He is not diaphoretic. No erythema. No pallor.  Psychiatric: He has a normal mood and affect. His behavior is normal. Judgment and thought content normal.  Nursing note and vitals  reviewed.  BP 130/90 (BP Location: Left Arm, Patient Position: Sitting, Cuff Size: Large)   Pulse 91   Temp 98.1 F (36.7 C) (Oral)   Resp 16   Ht 5' 9.5" (1.765 m)   Wt 239 lb (108.4 kg)   SpO2 97%   BMI 34.79 kg/m  Wt Readings from Last 3 Encounters:  04/03/17 239 lb (108.4 kg)  02/16/16 238 lb 9.6 oz (108.2 kg)  08/17/15 226 lb 8 oz (102.7 kg)     Lab Results  Component Value Date   WBC 4.7 04/01/2017   HGB 13.9 04/01/2017   HCT 41.5 04/01/2017   PLT 244.0 04/01/2017   GLUCOSE 115 (H) 04/01/2017   CHOL 182 04/01/2017   TRIG 231.0 (H) 04/01/2017   HDL 46.30 04/01/2017   LDLDIRECT 108.0 04/01/2017   LDLCALC 158 (H) 08/17/2015   ALT 53 04/01/2017   AST 31 04/01/2017   NA 136 04/01/2017   K 4.5 04/01/2017   CL 99 04/01/2017   CREATININE 0.86 04/01/2017   BUN 16 04/01/2017   CO2 30 04/01/2017   TSH 1.44 04/01/2017   PSA 0.28 02/20/2016    Dg Chest 2 View  Result Date: 08/17/2015 CLINICAL DATA:  Yearly physical exam, follow-up right lower pulmonary hamartoma EXAM: CHEST  2 VIEW COMPARISON:  11/20/2010 and 01/03/2011 FINDINGS: Cardiomediastinal silhouette is stable. No acute infiltrate or pulmonary edema. Stable right lower lobe posterior medial pulmonary nodular hamartoma measures 3.4 cm. IMPRESSION: No acute infiltrate or pulmonary edema. Stable right lower lobe posterior medial pulmonary nodular hamartoma measures 3.4 cm. Electronically Signed   By: Kevin Mueller M.D.   On: 08/17/2015 16:45     Assessment & Plan:  Plan  I am having Kevin Mueller start on fenofibrate. I am also having him maintain his amphetamine-dextroamphetamine, escitalopram, ALPRAZolam, and rosuvastatin.  Meds ordered this encounter  Medications  . fenofibrate 160 MG tablet    Sig: Take 1 tablet (160 mg total) by mouth daily.    Dispense:  90 tablet    Refill:  1  . rosuvastatin (CRESTOR) 5 MG tablet    Sig: Take 1 tablet (5 mg total) by mouth daily.    Dispense:  90 tablet    Refill:  1     Problem List Items Addressed This Visit      Unprioritized   Hyperlipidemia, mixed    Tolerating statin, encouraged heart healthy diet, avoid trans fats, minimize simple carbs and saturated fats. Increase exercise as tolerated      Relevant Medications   fenofibrate 160 MG tablet   rosuvastatin (CRESTOR) 5 MG tablet   Preventative health care    See avs ghm utd Check labs       Other Visit Diagnoses    Hyperlipidemia LDL goal <100    -  Primary   Relevant Medications   fenofibrate 160 MG tablet   rosuvastatin (CRESTOR) 5 MG tablet   Need for immunization against influenza       Relevant Orders   Flu Vaccine QUAD 36+ mos IM (Completed)   Hyperlipidemia, unspecified hyperlipidemia type       Relevant Medications   fenofibrate 160 MG tablet   rosuvastatin (CRESTOR) 5 MG tablet      Follow-up: Return in  about 1 year (around 04/03/2018) for annual exam, fasting.  Ann Held, DO

## 2017-04-03 NOTE — Assessment & Plan Note (Signed)
See avs ghm utd  Check labs 

## 2017-04-04 ENCOUNTER — Other Ambulatory Visit: Payer: Self-pay | Admitting: Family Medicine

## 2017-04-04 DIAGNOSIS — E785 Hyperlipidemia, unspecified: Secondary | ICD-10-CM

## 2017-04-04 DIAGNOSIS — R739 Hyperglycemia, unspecified: Secondary | ICD-10-CM

## 2017-05-02 DIAGNOSIS — M25571 Pain in right ankle and joints of right foot: Secondary | ICD-10-CM | POA: Diagnosis not present

## 2017-05-02 DIAGNOSIS — R030 Elevated blood-pressure reading, without diagnosis of hypertension: Secondary | ICD-10-CM | POA: Diagnosis not present

## 2017-07-22 ENCOUNTER — Other Ambulatory Visit: Payer: Self-pay | Admitting: Family Medicine

## 2017-07-22 DIAGNOSIS — E785 Hyperlipidemia, unspecified: Secondary | ICD-10-CM

## 2017-07-28 ENCOUNTER — Ambulatory Visit: Payer: Self-pay

## 2017-07-28 NOTE — Telephone Encounter (Signed)
Phone call from pt.  Reported onset of hives on Saturday, while spending weekend at a cabin[place] in [place] area.  Stated he started with hives on neck and back of hands. Described appearance as "raised clusters of red splotches, from pea-sized to larger patchy, welted areas."  Stated there is mild to moderate itching.  Reported the hives have spread to include areas of arms, face, ears, neck and scalp. Reported he is allergic to dust, pollen, and mold, and takes Zyrtec daily.  Also, has been taking Benadryl 25 mg, 2 tabs about every 6 hrs. while awake, but hasn't noticed any improvement in the hives.  Denied any swelling of lips, tongue, or airway.  Care advice given per protocol.  Appt. Scheduled for tomorrow, 2/5 @ 2:45 PM, with PCP.  Pt. Advised to go to the ER if worsening or swelling of lips, tongue, or airway.  Pt. Verb. Understanding.        Reason for Disposition . [1] MODERATE-SEVERE hives persist (i.e., hives interfere with normal activities or work) AND [2] taking antihistamine (e.g., Benadryl, Claritin) > 24 hours  Answer Assessment - Initial Assessment Questions 1. APPEARANCE: "What does the rash look like?"      Raised like a welt; grouped in wide patches or also isolated 2. LOCATION: "Where is the rash located?"     At onset was on the neck and backs of hands 3. NUMBER: "How many hives are there?"      Multiple areas 4. SIZE: "How big are the hives?" (inches, cm, compare to coins) "Do they all look the same or is there lots of variation in shape and size?"      Various sizes of patches; bright red 5. ONSET: "When did the hives begin?" (Hours or days ago)      Saturday 6. ITCHING: "Does it itch?" If so, ask: "How bad is the itch?"    - MILD: doesn't interfere with normal activities   - MODERATE-SEVERE: interferes with work, school, sleep, or other activities      Mild - moderate 7. RECURRENT PROBLEM: "Have you had hives before?" If so, ask: "When was the last time?" and "What happened  that time?"      Never before  8. TRIGGERS: "Were you exposed to any new food, plant, cosmetic product or animal just before the hives began?"    Recently out at cabin over weekend; walked among pine bark and trees 9. OTHER SYMPTOMS: "Do you have any other symptoms?" (e.g., fever, tongue swelling, difficulty breathing, abdominal pain)     Denied tongue swelling, difficulty breathing, fever or pain.  10. PREGNANCY: "Is there any chance you are pregnant?" "When was your last menstrual period?"       n/a  Protocols used: HIVES-A-AH

## 2017-07-28 NOTE — Telephone Encounter (Signed)
FYI

## 2017-07-29 ENCOUNTER — Ambulatory Visit (INDEPENDENT_AMBULATORY_CARE_PROVIDER_SITE_OTHER): Payer: BLUE CROSS/BLUE SHIELD | Admitting: Family Medicine

## 2017-07-29 ENCOUNTER — Encounter: Payer: Self-pay | Admitting: Family Medicine

## 2017-07-29 VITALS — BP 138/86 | HR 100 | Temp 98.0°F | Resp 16 | Ht 69.5 in | Wt 248.2 lb

## 2017-07-29 DIAGNOSIS — L239 Allergic contact dermatitis, unspecified cause: Secondary | ICD-10-CM | POA: Diagnosis not present

## 2017-07-29 MED ORDER — RANITIDINE HCL 150 MG PO TABS
150.0000 mg | ORAL_TABLET | Freq: Two times a day (BID) | ORAL | 2 refills | Status: DC
Start: 2017-07-29 — End: 2018-07-14

## 2017-07-29 MED ORDER — METHYLPREDNISOLONE ACETATE 80 MG/ML IJ SUSP
80.0000 mg | Freq: Once | INTRAMUSCULAR | Status: AC
  Administered 2017-07-29: 80 mg via INTRAMUSCULAR

## 2017-07-29 MED ORDER — PREDNISONE 10 MG PO TABS: ORAL_TABLET | ORAL | 0 refills | Status: DC

## 2017-07-29 NOTE — Progress Notes (Signed)
Patient ID: Kevin Mueller, male   DOB: 1975/10/31, 42 y.o.   MRN: [id number]    Subjective:  I acted as a Education administrator for Dr. Carollee Herter.  Guerry Bruin, [employer]   Patient ID: Kevin Mueller, male    DOB: February 27, 1976, 42 y.o.   MRN: [id number]  Chief Complaint  Patient presents with  . Urticaria    Urticaria  This is a new problem. Episode onset: saturday. The affected locations include the scalp, neck, face, left hand, right hand, left arm and right arm. The rash is characterized by redness and itchiness. Past treatments include antihistamine and topical steroids (benadryl, hydrocortisone).    Patient is in today for hives.  No new detergents , soaps, lotions etc.  Rash con't to worsen even after leaving the cabin and he has not worn any of the clothes he had with them.    Patient Care Team: Carollee Herter, Alferd Apa, DO as PCP - General (Family Medicine) Chucky May, MD as Consulting Physician (Psychiatry)   Past Medical History:  Diagnosis Date  . Elevated blood pressure (not hypertension)   . Hamartoma ([place])   . Hamartoma of lung ([place]) 10/20/2012  . Hyperlipidemia   . IBS (irritable bowel syndrome)   . Insomnia 09/26/2012    Past Surgical History:  Procedure Laterality Date  . LUNG BIOPSY     FNA under xray guidance 1998 @ [facility]    Family History  Problem Relation Age of Onset  . Diabetes Unknown   . Hyperlipidemia Unknown   . Cancer Unknown        PGM ? digestive  . Kidney disease Unknown     Social History   Socioeconomic History  . Marital status: Married    Spouse name: Not on file  . Number of children: Not on file  . Years of education: Not on file  . Highest education level: Not on file  Social Needs  . Financial resource strain: Not on file  . Food insecurity - worry: Not on file  . Food insecurity - inability: Not on file  . Transportation needs - medical: Not on file  . Transportation needs - non-medical: Not on file  Occupational History  . Occupation:  Soil scientist for [employer]   Tobacco Use  . Smoking status: Former Smoker    Packs/day: 2.00    Types: Cigarettes    Start date: 10/01/2006  . Smokeless tobacco: Never Used  Substance and Sexual Activity  . Alcohol use: Yes    Alcohol/week: 16.8 oz    Types: 28 Cans of beer per week  . Drug use: No  . Sexual activity: Yes    Partners: Female  Other Topics Concern  . Not on file  Social History Narrative   Exercise---  Some weights,  Running after kids    Outpatient Medications Prior to Visit  Medication Sig Dispense Refill  . ALPRAZolam (XANAX) 0.25 MG tablet Take 0.25 mg by mouth at bedtime as needed for anxiety. Reported on 08/17/2015    . amphetamine-dextroamphetamine (ADDERALL) 20 MG tablet Take 20 mg by mouth 3 (three) times daily.      Marland Kitchen escitalopram (LEXAPRO) 20 MG tablet Take 1 tablet (20 mg total) by mouth daily.    . fenofibrate 160 MG tablet TAKE 1 TABLET(160 MG) BY MOUTH DAILY 90 tablet 0  . rosuvastatin (CRESTOR) 5 MG tablet Take 1 tablet (5 mg total) by mouth daily. 90 tablet 1   No facility-administered medications prior to visit.  Allergies  Allergen Reactions  . Codeine     Adverse reaction, per pt  . Simvastatin     ROS     Objective:    Physical Exam  Skin: Rash noted. Rash is papular.     Nursing note and vitals reviewed.   BP 138/86 (BP Location: Left Arm, Cuff Size: Normal)   Pulse 100   Temp 98 F (36.7 C) (Oral)   Resp 16   Ht 5' 9.5" (1.765 m)   Wt 248 lb 3.2 oz (112.6 kg)   SpO2 96%   BMI 36.13 kg/m  Wt Readings from Last 3 Encounters:  07/29/17 248 lb 3.2 oz (112.6 kg)  04/03/17 239 lb (108.4 kg)  02/16/16 238 lb 9.6 oz (108.2 kg)   BP Readings from Last 3 Encounters:  07/29/17 138/86  04/03/17 130/90  02/16/16 120/90     Immunization History  Administered Date(s) Administered  . Influenza Whole 04/04/2010, 04/27/2012  . Influenza,inj,Quad PF,6+ Mos 04/03/2017  . Influenza-Unspecified 03/14/2014  . Td  01/05/2003  . Tdap 03/21/2014    Health Maintenance  Topic Date Due  . HIV Screening  08/25/1990  . TETANUS/TDAP  03/21/2024  . INFLUENZA VACCINE  Completed    Lab Results  Component Value Date   WBC 4.7 04/01/2017   HGB 13.9 04/01/2017   HCT 41.5 04/01/2017   PLT 244.0 04/01/2017   GLUCOSE 115 (H) 04/01/2017   CHOL 182 04/01/2017   TRIG 231.0 (H) 04/01/2017   HDL 46.30 04/01/2017   LDLDIRECT 108.0 04/01/2017   LDLCALC 158 (H) 08/17/2015   ALT 53 04/01/2017   AST 31 04/01/2017   NA 136 04/01/2017   K 4.5 04/01/2017   CL 99 04/01/2017   CREATININE 0.86 04/01/2017   BUN 16 04/01/2017   CO2 30 04/01/2017   TSH 1.44 04/01/2017   PSA 0.28 02/20/2016    Lab Results  Component Value Date   TSH 1.44 04/01/2017   Lab Results  Component Value Date   WBC 4.7 04/01/2017   HGB 13.9 04/01/2017   HCT 41.5 04/01/2017   MCV 91.0 04/01/2017   PLT 244.0 04/01/2017   Lab Results  Component Value Date   NA 136 04/01/2017   K 4.5 04/01/2017   CO2 30 04/01/2017   GLUCOSE 115 (H) 04/01/2017   BUN 16 04/01/2017   CREATININE 0.86 04/01/2017   BILITOT 0.4 04/01/2017   ALKPHOS 59 04/01/2017   AST 31 04/01/2017   ALT 53 04/01/2017   PROT 7.5 04/01/2017   ALBUMIN 4.5 04/01/2017   CALCIUM 9.5 04/01/2017   GFR 103.85 04/01/2017   Lab Results  Component Value Date   CHOL 182 04/01/2017   Lab Results  Component Value Date   HDL 46.30 04/01/2017   Lab Results  Component Value Date   LDLCALC 158 (H) 08/17/2015   Lab Results  Component Value Date   TRIG 231.0 (H) 04/01/2017   Lab Results  Component Value Date   CHOLHDL 4 04/01/2017   No results found for: HGBA1C       Assessment & Plan:   Problem List Items Addressed This Visit    None    Visit Diagnoses    Allergic contact dermatitis, unspecified trigger    -  Primary   Relevant Medications   predniSONE (DELTASONE) 10 MG tablet   ranitidine (ZANTAC) 150 MG tablet   methylPREDNISolone acetate (DEPO-MEDROL)  injection 80 mg (Completed)      I am having Kevin Mueller start  on predniSONE and ranitidine. I am also having him maintain his amphetamine-dextroamphetamine, escitalopram, ALPRAZolam, rosuvastatin, and fenofibrate. We administered methylPREDNISolone acetate.  Meds ordered this encounter  Medications  . predniSONE (DELTASONE) 10 MG tablet    Sig: TAKE 3 TABLETS PO QD FOR 3 DAYS THEN TAKE 2 TABLETS PO QD FOR 3 DAYS THEN TAKE 1 TABLET PO QD FOR 3 DAYS THEN TAKE 1/2 TAB PO QD FOR 3 DAYS    Dispense:  20 tablet    Refill:  0  . ranitidine (ZANTAC) 150 MG tablet    Sig: Take 1 tablet (150 mg total) by mouth 2 (two) times daily.    Dispense:  60 tablet    Refill:  2  . methylPREDNISolone acetate (DEPO-MEDROL) injection 80 mg    CMA served as scribe during this visit. History, Physical and Plan performed by medical provider. Documentation and orders reviewed and attested to.  Ann Held, DO

## 2017-07-29 NOTE — Patient Instructions (Signed)
Contact Dermatitis Dermatitis is redness, soreness, and swelling (inflammation) of the skin. Contact dermatitis is a reaction to certain substances that touch the skin. You either touched something that irritated your skin, or you have allergies to something you touched. Follow these instructions at home: Skin Care  Moisturize your skin as needed.  Apply cool compresses to the affected areas.  Try taking a bath with: ? Epsom salts. Follow the instructions on the package. You can get these at a pharmacy or grocery store. ? Baking soda. Pour a small amount into the bath as told by your doctor. ? Colloidal oatmeal. Follow the instructions on the package. You can get this at a pharmacy or grocery store.  Try applying baking soda paste to your skin. Stir water into baking soda until it looks like paste.  Do not scratch your skin.  Bathe less often.  Bathe in lukewarm water. Avoid using hot water. Medicines  Take or apply over-the-counter and prescription medicines only as told by your doctor.  If you were prescribed an antibiotic medicine, take or apply your antibiotic as told by your doctor. Do not stop taking the antibiotic even if your condition starts to get better. General instructions  Keep all follow-up visits as told by your doctor. This is important.  Avoid the substance that caused your reaction. If you do not know what caused it, keep a journal to try to track what caused it. Write down: ? What you eat. ? What cosmetic products you use. ? What you drink. ? What you wear in the affected area. This includes jewelry.  If you were given a bandage (dressing), take care of it as told by your doctor. This includes when to change and remove it. Contact a doctor if:  You do not get better with treatment.  Your condition gets worse.  You have signs of infection such as: ? Swelling. ? Tenderness. ? Redness. ? Soreness. ? Warmth.  You have a fever.  You have new  symptoms. Get help right away if:  You have a very bad headache.  You have neck pain.  Your neck is stiff.  You throw up (vomit).  You feel very sleepy.  You see red streaks coming from the affected area.  Your bone or joint underneath the affected area becomes painful after the skin has healed.  The affected area turns darker.  You have trouble breathing. This information is not intended to replace advice given to you by your health care provider. Make sure you discuss any questions you have with your health care provider. Document Released: 04/07/2009 Document Revised: 11/16/2015 Document Reviewed: 10/26/2014 Elsevier Interactive Patient Education  2018 Elsevier Inc.  

## 2017-08-07 DIAGNOSIS — J019 Acute sinusitis, unspecified: Secondary | ICD-10-CM | POA: Diagnosis not present

## 2017-08-07 DIAGNOSIS — J101 Influenza due to other identified influenza virus with other respiratory manifestations: Secondary | ICD-10-CM | POA: Diagnosis not present

## 2017-08-15 DIAGNOSIS — F4010 Social phobia, unspecified: Secondary | ICD-10-CM | POA: Diagnosis not present

## 2017-08-15 DIAGNOSIS — F900 Attention-deficit hyperactivity disorder, predominantly inattentive type: Secondary | ICD-10-CM | POA: Diagnosis not present

## 2017-10-20 ENCOUNTER — Other Ambulatory Visit: Payer: Self-pay | Admitting: Family Medicine

## 2017-10-20 DIAGNOSIS — E785 Hyperlipidemia, unspecified: Secondary | ICD-10-CM

## 2017-11-14 ENCOUNTER — Other Ambulatory Visit: Payer: Self-pay | Admitting: Family Medicine

## 2017-11-14 DIAGNOSIS — E785 Hyperlipidemia, unspecified: Secondary | ICD-10-CM

## 2017-11-14 MED ORDER — ROSUVASTATIN CALCIUM 5 MG PO TABS: 5.0000 mg | ORAL_TABLET | Freq: Every day | ORAL | 1 refills | Status: DC

## 2017-11-14 MED ORDER — FENOFIBRATE 160 MG PO TABS: 160.0000 mg | ORAL_TABLET | Freq: Every day | ORAL | 1 refills | Status: DC

## 2018-01-20 ENCOUNTER — Other Ambulatory Visit: Payer: Self-pay | Admitting: Family Medicine

## 2018-01-20 DIAGNOSIS — E785 Hyperlipidemia, unspecified: Secondary | ICD-10-CM

## 2018-02-10 DIAGNOSIS — F4010 Social phobia, unspecified: Secondary | ICD-10-CM | POA: Diagnosis not present

## 2018-02-10 DIAGNOSIS — F900 Attention-deficit hyperactivity disorder, predominantly inattentive type: Secondary | ICD-10-CM | POA: Diagnosis not present

## 2018-05-19 ENCOUNTER — Other Ambulatory Visit: Payer: Self-pay | Admitting: Family Medicine

## 2018-05-19 DIAGNOSIS — E785 Hyperlipidemia, unspecified: Secondary | ICD-10-CM

## 2018-05-27 ENCOUNTER — Other Ambulatory Visit: Payer: Self-pay | Admitting: Family Medicine

## 2018-05-27 DIAGNOSIS — E785 Hyperlipidemia, unspecified: Secondary | ICD-10-CM

## 2018-05-27 NOTE — Telephone Encounter (Signed)
Pt. requesting crestor refill. LOV 07/2017, with last lipid bloodwork 03/2017. Author phoned pt. to set up OV prior to additional refills per protocol. Author left detailed VM requesting for pt. to set up OV. OK for PEC to set up appointment, and refill crestor X 1 month if pt. Completely out before scheduled appointment.

## 2018-07-07 ENCOUNTER — Other Ambulatory Visit: Payer: Self-pay | Admitting: Family Medicine

## 2018-07-07 DIAGNOSIS — E785 Hyperlipidemia, unspecified: Secondary | ICD-10-CM

## 2018-07-07 MED ORDER — ROSUVASTATIN CALCIUM 5 MG PO TABS: 5.0000 mg | ORAL_TABLET | Freq: Every day | ORAL | 1 refills | Status: DC

## 2018-07-07 MED ORDER — FENOFIBRATE 160 MG PO TABS: 160.0000 mg | ORAL_TABLET | Freq: Every day | ORAL | 0 refills | Status: DC

## 2018-07-07 NOTE — Telephone Encounter (Signed)
Copied from [place] [id number]. Topic: Quick Communication - Rx Refill/Question >> Jul 07, 2018 11:07 AM Sheran Luz wrote: Medication: fenofibrate 160 MG tablet and rosuvastatin (CRESTOR) 5 MG tablet   Patient is requesting a partial refill of these medications be sent to pharmacy. Patient is scheduled for OV on 1/21. Please advise.   Preferred Pharmacy (with phone number or street name):[id number] - [place], [address] AT [facility] [place] [phone] (Phone) [phone] (Fax)

## 2018-07-07 NOTE — Telephone Encounter (Signed)
Requested Prescriptions  Pending Prescriptions Disp Refills  . fenofibrate 160 MG tablet 7 tablet 0    Sig: Take 1 tablet (160 mg total) by mouth daily.     Cardiovascular:  Antilipid - Fibric Acid Derivatives Failed - 07/07/2018 11:14 AM      Failed - Total Cholesterol in normal range and within 360 days    Cholesterol  Date Value Ref Range Status  04/01/2017 182 0 - 200 mg/dL Final    Comment:    ATP III Classification       Desirable:  < 200 mg/dL               Borderline High:  200 - 239 mg/dL          High:  > = 240 mg/dL         Failed - LDL in normal range and within 360 days    LDL Cholesterol  Date Value Ref Range Status  08/17/2015 158 (H) 0 - 99 mg/dL Final         Failed - HDL in normal range and within 360 days    HDL  Date Value Ref Range Status  04/01/2017 46.30 >39.00 mg/dL Final         Failed - Triglycerides in normal range and within 360 days    Triglycerides  Date Value Ref Range Status  04/01/2017 231.0 (H) 0.0 - 149.0 mg/dL Final    Comment:    Normal:  <150 mg/dLBorderline High:  150 - 199 mg/dL         Failed - ALT in normal range and within 180 days    ALT  Date Value Ref Range Status  04/01/2017 53 0 - 53 U/L Final         Failed - AST in normal range and within 180 days    AST  Date Value Ref Range Status  04/01/2017 31 0 - 37 U/L Final         Failed - Cr in normal range and within 180 days    Creat  Date Value Ref Range Status  11/30/2012 0.90 0.50 - 1.35 mg/dL Final   Creatinine, Ser  Date Value Ref Range Status  04/01/2017 0.86 0.40 - 1.50 mg/dL Final         Failed - eGFR in normal range and within 180 days    GFR calc Af Amer  Date Value Ref Range Status  08/29/2006 127 mL/min Final   GFR calc non Af Amer  Date Value Ref Range Status  06/08/2010 104.87 >60.00 mL/min Final   GFR  Date Value Ref Range Status  04/01/2017 103.85 >60.00 mL/min Final         Passed - Valid encounter within last 12 months    Recent  Outpatient Visits          11 months ago Allergic contact dermatitis, unspecified trigger   [facility] [place] [facility], DO   1 year ago Hyperlipidemia LDL goal <100   [facility] [place], DO   2 years ago Preventative health care   [place] [facility] [place], [place], DO   2 years ago Hyperlipidemia, mixed   [facility] [place], MD   4 years ago Need for Tdap vaccination   [facility]  [place]osie Lukes, MD      Future Appointments            In 1 week Carollee Herter, Alferd Apa, DO [place] [facility], [place]         . rosuvastatin (CRESTOR) 5 MG tablet 90 tablet 1    Sig: Take 1 tablet (5 mg total) by mouth at bedtime.     Cardiovascular:  Antilipid - Statins Failed - 07/07/2018 11:14 AM      Failed - Total Cholesterol in normal range and within 360 days    Cholesterol  Date Value Ref Range Status  04/01/2017 182 0 - 200 mg/dL Final    Comment:    ATP III Classification       Desirable:  < 200 mg/dL               Borderline High:  200 - 239 mg/dL          High:  > = 240 mg/dL         Failed - LDL in normal range and within 360 days    LDL Cholesterol  Date Value Ref Range Status  08/17/2015 158 (H) 0 - 99 mg/dL Final         Failed - HDL in normal range and within 360 days    HDL  Date Value Ref Range Status  04/01/2017 46.30 >39.00 mg/dL Final         Failed - Triglycerides in normal range and within 360 days    Triglycerides  Date Value Ref Range Status  04/01/2017 231.0 (H) 0.0 - 149.0 mg/dL Final    Comment:    Normal:  <150 mg/dLBorderline High:  150 - 199 mg/dL         Passed - Patient is not pregnant      Passed - Valid encounter within last 12 months    Recent Outpatient Visits          11  months ago Allergic contact dermatitis, unspecified trigger   [facility] [place], DO   1 year ago Hyperlipidemia LDL goal <100   [facility] [place], DO   2 years ago Preventative health care   [place] [facility] [place], [place], DO   2 years ago Hyperlipidemia, mixed   [facility] [place], MD   4 years ago Need for Tdap vaccination   [facility] [place], MD      Future Appointments            In 1 week Carollee Herter, Alferd Apa, [place] [facility], [facility]

## 2018-07-14 ENCOUNTER — Encounter: Payer: Self-pay | Admitting: Family Medicine

## 2018-07-14 ENCOUNTER — Ambulatory Visit (INDEPENDENT_AMBULATORY_CARE_PROVIDER_SITE_OTHER): Payer: BLUE CROSS/BLUE SHIELD | Admitting: Family Medicine

## 2018-07-14 VITALS — BP 130/88 | HR 87 | Temp 98.2°F | Resp 16 | Ht 70.0 in | Wt 248.8 lb

## 2018-07-14 DIAGNOSIS — E785 Hyperlipidemia, unspecified: Secondary | ICD-10-CM | POA: Diagnosis not present

## 2018-07-14 DIAGNOSIS — E782 Mixed hyperlipidemia: Secondary | ICD-10-CM

## 2018-07-14 MED ORDER — FENOFIBRATE 160 MG PO TABS: 160.0000 mg | ORAL_TABLET | Freq: Every day | ORAL | 1 refills | Status: DC

## 2018-07-14 MED ORDER — ROSUVASTATIN CALCIUM 5 MG PO TABS: ORAL_TABLET | ORAL | 1 refills | Status: DC

## 2018-07-14 NOTE — Assessment & Plan Note (Signed)
Encouraged heart healthy diet, increase exercise, avoid trans fats, consider a krill oil cap daily 

## 2018-07-14 NOTE — Patient Instructions (Signed)

## 2018-07-14 NOTE — Progress Notes (Signed)
Patient ID: Kevin Mueller, male    DOB: [birth year]  Age: 43 y.o. MRN: [id number]    Subjective:  Subjective  HPI FINNIGAN WARRINER presents for f/u cholesterol.  No complaints.   Review of Systems  Constitutional: Negative for appetite change, diaphoresis, fatigue and unexpected weight change.  Eyes: Negative for pain, redness and visual disturbance.  Respiratory: Negative for cough, chest tightness, shortness of breath and wheezing.   Cardiovascular: Negative for chest pain, palpitations and leg swelling.  Endocrine: Negative for cold intolerance, heat intolerance, polydipsia, polyphagia and polyuria.  Genitourinary: Negative for difficulty urinating, dysuria and frequency.  Neurological: Negative for dizziness, light-headedness, numbness and headaches.    History Past Medical History:  Diagnosis Date  . Elevated blood pressure (not hypertension)   . Hamartoma ([place])   . Hamartoma of lung ([place]) 10/20/2012  . Hyperlipidemia   . IBS (irritable bowel syndrome)   . Insomnia 09/26/2012    He has a past surgical history that includes Lung biopsy.   His family history includes Cancer in his unknown relative; Diabetes in his unknown relative; Hyperlipidemia in his unknown relative; Kidney disease in his unknown relative.He reports that he has quit smoking. His smoking use included cigarettes. He started smoking about 11 years ago. He smoked 2.00 packs per day. He has never used smokeless tobacco. He reports current alcohol use of about 28.0 standard drinks of alcohol per week. He reports that he does not use drugs.  Current Outpatient Medications on File Prior to Visit  Medication Sig Dispense Refill  . ALPRAZolam (XANAX) 0.25 MG tablet Take 0.25 mg by mouth at bedtime as needed for anxiety. Reported on 08/17/2015    . amphetamine-dextroamphetamine (ADDERALL) 20 MG tablet Take 20 mg by mouth 3 (three) times daily.      Marland Kitchen escitalopram (LEXAPRO) 20 MG tablet Take 1 tablet (20 mg total) by mouth  daily.     No current facility-administered medications on file prior to visit.      Objective:  Objective  Physical Exam Vitals signs and nursing note reviewed.  Constitutional:      General: He is sleeping.     Appearance: He is well-developed.  HENT:     Head: Normocephalic and atraumatic.  Eyes:     Pupils: Pupils are equal, round, and reactive to light.  Neck:     Musculoskeletal: Normal range of motion and neck supple.     Thyroid: No thyromegaly.  Cardiovascular:     Rate and Rhythm: Normal rate and regular rhythm.     Heart sounds: No murmur.  Pulmonary:     Effort: Pulmonary effort is normal. No respiratory distress.     Breath sounds: Normal breath sounds. No wheezing or rales.  Chest:     Chest wall: No tenderness.  Musculoskeletal:        General: No tenderness.  Skin:    General: Skin is warm and dry.  Neurological:     Mental Status: He is oriented to person, place, and time.  Psychiatric:        Behavior: Behavior normal.        Thought Content: Thought content normal.        Judgment: Judgment normal.    BP 130/88 (BP Location: Right Arm, Cuff Size: Large)   Pulse 87   Temp 98.2 F (36.8 C) (Oral)   Resp 16   Ht 5\' 10"  (1.778 m)   Wt 248 lb 12.8 oz (112.9 kg)   SpO2 98%  BMI 35.70 kg/m  Wt Readings from Last 3 Encounters:  07/14/18 248 lb 12.8 oz (112.9 kg)  07/29/17 248 lb 3.2 oz (112.6 kg)  04/03/17 239 lb (108.4 kg)     Lab Results  Component Value Date   WBC 4.7 04/01/2017   HGB 13.9 04/01/2017   HCT 41.5 04/01/2017   PLT 244.0 04/01/2017   GLUCOSE 115 (H) 04/01/2017   CHOL 182 04/01/2017   TRIG 231.0 (H) 04/01/2017   HDL 46.30 04/01/2017   LDLDIRECT 108.0 04/01/2017   LDLCALC 158 (H) 08/17/2015   ALT 53 04/01/2017   AST 31 04/01/2017   NA 136 04/01/2017   K 4.5 04/01/2017   CL 99 04/01/2017   CREATININE 0.86 04/01/2017   BUN 16 04/01/2017   CO2 30 04/01/2017   TSH 1.44 04/01/2017   PSA 0.28 02/20/2016    Dg Chest 2  View  Result Date: 08/17/2015 CLINICAL DATA:  Yearly physical exam, follow-up right lower pulmonary hamartoma EXAM: CHEST  2 VIEW COMPARISON:  11/20/2010 and 01/03/2011 FINDINGS: Cardiomediastinal silhouette is stable. No acute infiltrate or pulmonary edema. Stable right lower lobe posterior medial pulmonary nodular hamartoma measures 3.4 cm. IMPRESSION: No acute infiltrate or pulmonary edema. Stable right lower lobe posterior medial pulmonary nodular hamartoma measures 3.4 cm. Electronically Signed   By: Lahoma Crocker M.D.   On: 08/17/2015 16:45     Assessment & Plan:  Plan  I have discontinued Odel R. Bowditch's predniSONE and ranitidine. I am also having him maintain his amphetamine-dextroamphetamine, escitalopram, ALPRAZolam, rosuvastatin, and fenofibrate.  Meds ordered this encounter  Medications  . rosuvastatin (CRESTOR) 5 MG tablet    Sig: TAKE 1 TABLET(5 MG) BY MOUTH DAILY    Dispense:  90 tablet    Refill:  1  . fenofibrate 160 MG tablet    Sig: Take 1 tablet (160 mg total) by mouth daily.    Dispense:  90 tablet    Refill:  1    Problem List Items Addressed This Visit    None    Visit Diagnoses    Hyperlipidemia, unspecified hyperlipidemia type    -  Primary   Relevant Medications   rosuvastatin (CRESTOR) 5 MG tablet   fenofibrate 160 MG tablet   Other Relevant Orders   Lipid panel   Comprehensive metabolic panel   Hyperlipidemia LDL goal <100       Relevant Medications   rosuvastatin (CRESTOR) 5 MG tablet   fenofibrate 160 MG tablet      Follow-up: Return in about 6 months (around 01/12/2019), or if symptoms worsen or fail to improve, for hypertension, hyperlipidemia.  Ann Held, DO

## 2018-07-16 ENCOUNTER — Other Ambulatory Visit: Payer: Self-pay | Admitting: Family Medicine

## 2018-07-16 DIAGNOSIS — E785 Hyperlipidemia, unspecified: Secondary | ICD-10-CM

## 2018-07-24 NOTE — Addendum Note (Signed)
Addended by: Kelle Darting A on: 07/24/2018 08:53 AM   Modules accepted: Orders

## 2018-07-29 ENCOUNTER — Other Ambulatory Visit (INDEPENDENT_AMBULATORY_CARE_PROVIDER_SITE_OTHER): Payer: BLUE CROSS/BLUE SHIELD

## 2018-07-29 DIAGNOSIS — E785 Hyperlipidemia, unspecified: Secondary | ICD-10-CM

## 2018-07-30 LAB — LIPID PANEL
Cholesterol: 176 mg/dL (ref 0–200)
HDL: 54.4 mg/dL (ref >39.00)
LDL Cholesterol: 100 mg/dL — ABNORMAL HIGH (ref 0–99)
NONHDL: 122.02
Total CHOL/HDL Ratio: 3
Triglycerides: 109 mg/dL (ref 0.0–149.0)
VLDL: 21.8 mg/dL (ref 0.0–40.0)

## 2018-07-30 LAB — COMPREHENSIVE METABOLIC PANEL
ALT: 77 U/L — ABNORMAL HIGH (ref 0–53)
AST: 38 U/L — ABNORMAL HIGH (ref 0–37)
Albumin: 5.1 g/dL (ref 3.5–5.2)
Alkaline Phosphatase: 55 U/L (ref 39–117)
BUN: 19 mg/dL (ref 6–23)
CO2: 31 mEq/L (ref 19–32)
Calcium: 10.3 mg/dL (ref 8.4–10.5)
Chloride: 101 mEq/L (ref 96–112)
Creatinine, Ser: 1.16 mg/dL (ref 0.40–1.50)
GFR: 68.74 mL/min (ref >60.00)
Glucose, Bld: 94 mg/dL (ref 70–99)
Potassium: 5.4 mEq/L — ABNORMAL HIGH (ref 3.5–5.1)
Sodium: 139 mEq/L (ref 135–145)
Total Bilirubin: 0.6 mg/dL (ref 0.2–1.2)
Total Protein: 7.6 g/dL (ref 6.0–8.3)

## 2018-08-03 ENCOUNTER — Other Ambulatory Visit: Payer: Self-pay | Admitting: *Deleted

## 2018-08-03 DIAGNOSIS — E875 Hyperkalemia: Secondary | ICD-10-CM

## 2018-08-03 DIAGNOSIS — R740 Nonspecific elevation of levels of transaminase and lactic acid dehydrogenase [LDH]: Principal | ICD-10-CM

## 2018-08-03 DIAGNOSIS — R7401 Elevation of levels of liver transaminase levels: Secondary | ICD-10-CM

## 2018-08-10 ENCOUNTER — Other Ambulatory Visit (INDEPENDENT_AMBULATORY_CARE_PROVIDER_SITE_OTHER): Payer: BLUE CROSS/BLUE SHIELD

## 2018-08-10 DIAGNOSIS — E875 Hyperkalemia: Secondary | ICD-10-CM

## 2018-08-10 DIAGNOSIS — R740 Nonspecific elevation of levels of transaminase and lactic acid dehydrogenase [LDH]: Secondary | ICD-10-CM | POA: Diagnosis not present

## 2018-08-10 DIAGNOSIS — R7401 Elevation of levels of liver transaminase levels: Secondary | ICD-10-CM

## 2018-08-10 LAB — COMPREHENSIVE METABOLIC PANEL
ALBUMIN: 4.7 g/dL (ref 3.5–5.2)
ALT: 79 U/L — ABNORMAL HIGH (ref 0–53)
AST: 52 U/L — ABNORMAL HIGH (ref 0–37)
Alkaline Phosphatase: 50 U/L (ref 39–117)
BUN: 14 mg/dL (ref 6–23)
CHLORIDE: 104 meq/L (ref 96–112)
CO2: 30 mEq/L (ref 19–32)
Calcium: 9.8 mg/dL (ref 8.4–10.5)
Creatinine, Ser: 1.11 mg/dL (ref 0.40–1.50)
GFR: 72.31 mL/min (ref >60.00)
Glucose, Bld: 102 mg/dL — ABNORMAL HIGH (ref 70–99)
Potassium: 5.3 mEq/L — ABNORMAL HIGH (ref 3.5–5.1)
Sodium: 142 mEq/L (ref 135–145)
Total Bilirubin: 0.4 mg/dL (ref 0.2–1.2)
Total Protein: 7.1 g/dL (ref 6.0–8.3)

## 2018-08-14 ENCOUNTER — Other Ambulatory Visit: Payer: Self-pay | Admitting: Family Medicine

## 2018-08-14 DIAGNOSIS — E785 Hyperlipidemia, unspecified: Secondary | ICD-10-CM

## 2018-08-14 DIAGNOSIS — F411 Generalized anxiety disorder: Secondary | ICD-10-CM | POA: Diagnosis not present

## 2018-08-14 DIAGNOSIS — R748 Abnormal levels of other serum enzymes: Secondary | ICD-10-CM

## 2018-08-14 DIAGNOSIS — F900 Attention-deficit hyperactivity disorder, predominantly inattentive type: Secondary | ICD-10-CM | POA: Diagnosis not present

## 2019-01-23 ENCOUNTER — Other Ambulatory Visit: Payer: Self-pay | Admitting: Family Medicine

## 2019-01-23 DIAGNOSIS — E785 Hyperlipidemia, unspecified: Secondary | ICD-10-CM

## 2019-02-05 DIAGNOSIS — F411 Generalized anxiety disorder: Secondary | ICD-10-CM | POA: Diagnosis not present

## 2019-02-05 DIAGNOSIS — F900 Attention-deficit hyperactivity disorder, predominantly inattentive type: Secondary | ICD-10-CM | POA: Diagnosis not present

## 2019-02-05 DIAGNOSIS — F4322 Adjustment disorder with anxiety: Secondary | ICD-10-CM | POA: Diagnosis not present

## 2019-10-06 ENCOUNTER — Encounter: Payer: Self-pay | Admitting: Family Medicine

## 2019-10-07 NOTE — Telephone Encounter (Signed)
Pt should make ov

## 2020-02-17 ENCOUNTER — Encounter: Payer: Self-pay | Admitting: Family Medicine

## 2020-02-18 ENCOUNTER — Encounter: Payer: Self-pay | Admitting: Family Medicine

## 2020-02-22 ENCOUNTER — Encounter: Payer: Self-pay | Admitting: Family Medicine

## 2020-02-22 ENCOUNTER — Ambulatory Visit (INDEPENDENT_AMBULATORY_CARE_PROVIDER_SITE_OTHER): Payer: 59 | Admitting: Family Medicine

## 2020-02-22 ENCOUNTER — Other Ambulatory Visit: Payer: Self-pay

## 2020-02-22 VITALS — BP 160/100 | HR 94 | Temp 98.1°F | Resp 18 | Ht 70.0 in | Wt 250.2 lb

## 2020-02-22 DIAGNOSIS — E782 Mixed hyperlipidemia: Secondary | ICD-10-CM | POA: Diagnosis not present

## 2020-02-22 DIAGNOSIS — Q859 Phakomatosis, unspecified: Secondary | ICD-10-CM

## 2020-02-22 DIAGNOSIS — E785 Hyperlipidemia, unspecified: Secondary | ICD-10-CM | POA: Diagnosis not present

## 2020-02-22 DIAGNOSIS — M67432 Ganglion, left wrist: Secondary | ICD-10-CM | POA: Diagnosis not present

## 2020-02-22 NOTE — Patient Instructions (Signed)

## 2020-02-22 NOTE — Assessment & Plan Note (Signed)
Check cxr .   

## 2020-02-22 NOTE — Assessment & Plan Note (Signed)
Will con't to watch Consider referal ortho-- pt wants to wait

## 2020-02-22 NOTE — Assessment & Plan Note (Signed)
Check labs Encouraged heart healthy diet, increase exercise, avoid trans fats, consider a krill oil cap daily

## 2020-02-22 NOTE — Progress Notes (Signed)
Patient ID: Kevin Mueller, male    DOB: [birth year]  Age: 44 y.o. MRN: [id number]    Subjective:  Subjective  HPI Kevin Mueller presents for f/u cholesterol and c/o knot on L arm.  nontender  He also needs to recheck cxr for pulm nodule   Review of Systems  Constitutional: Negative for appetite change, diaphoresis, fatigue and unexpected weight change.  Eyes: Negative for pain, redness and visual disturbance.  Respiratory: Negative for cough, chest tightness, shortness of breath and wheezing.   Cardiovascular: Negative for chest pain, palpitations and leg swelling.  Endocrine: Negative for cold intolerance, heat intolerance, polydipsia, polyphagia and polyuria.  Genitourinary: Negative for difficulty urinating, dysuria and frequency.  Neurological: Negative for dizziness, light-headedness, numbness and headaches.    History Past Medical History:  Diagnosis Date  . Elevated blood pressure (not hypertension)   . Hamartoma ([place])   . Hamartoma of lung ([place]) 10/20/2012  . Hyperlipidemia   . IBS (irritable bowel syndrome)   . Insomnia 09/26/2012    He has a past surgical history that includes Lung biopsy.   His family history includes Cancer in his unknown relative; Diabetes in his unknown relative; Hyperlipidemia in his unknown relative; Kidney disease in his unknown relative.He reports that he has quit smoking. His smoking use included cigarettes. He started smoking about 13 years ago. He smoked 2.00 packs per day. He has never used smokeless tobacco. He reports current alcohol use of about 28.0 standard drinks of alcohol per week. He reports that he does not use drugs.  Current Outpatient Medications on File Prior to Visit  Medication Sig Dispense Refill  . amphetamine-dextroamphetamine (ADDERALL) 20 MG tablet Take 20 mg by mouth 3 (three) times daily.      Marland Kitchen escitalopram (LEXAPRO) 20 MG tablet Take 1 tablet (20 mg total) by mouth daily.    . fenofibrate 160 MG tablet Take 1 tablet  (160 mg total) by mouth daily. (Patient not taking: Reported on 02/22/2020) 90 tablet 1  . rosuvastatin (CRESTOR) 5 MG tablet TAKE 1 TABLET(5 MG) BY MOUTH AT BEDTIME (Patient not taking: Reported on 02/22/2020) 90 tablet 1   No current facility-administered medications on file prior to visit.     Objective:  Objective  Physical Exam Vitals and nursing note reviewed.  Constitutional:      General: He is sleeping.     Appearance: He is well-developed.  HENT:     Head: Normocephalic and atraumatic.  Eyes:     Pupils: Pupils are equal, round, and reactive to light.  Neck:     Thyroid: No thyromegaly.  Cardiovascular:     Rate and Rhythm: Normal rate and regular rhythm.     Heart sounds: No murmur heard.   Pulmonary:     Effort: Pulmonary effort is normal. No respiratory distress.     Breath sounds: Normal breath sounds. No wheezing or rales.  Chest:     Chest wall: No tenderness.  Musculoskeletal:        General: No tenderness.     Left forearm: Swelling present.       Arms:     Cervical back: Normal range of motion and neck supple.  Skin:    General: Skin is warm and dry.       Neurological:     Mental Status: He is oriented to person, place, and time.  Psychiatric:        Behavior: Behavior normal.        Thought Content:  Thought content normal.        Judgment: Judgment normal.    BP (!) 160/100 (BP Location: Left Arm, Patient Position: Sitting, Cuff Size: Large)   Pulse 94   Temp 98.1 F (36.7 C) (Oral)   Resp 18   Ht 5\' 10"  (1.778 m)   Wt 250 lb 3.2 oz (113.5 kg)   SpO2 98%   BMI 35.90 kg/m  Wt Readings from Last 3 Encounters:  02/22/20 250 lb 3.2 oz (113.5 kg)  07/14/18 248 lb 12.8 oz (112.9 kg)  07/29/17 248 lb 3.2 oz (112.6 kg)     Lab Results  Component Value Date   WBC 4.7 04/01/2017   HGB 13.9 04/01/2017   HCT 41.5 04/01/2017   PLT 244.0 04/01/2017   GLUCOSE 102 (H) 08/10/2018   CHOL 176 07/29/2018   TRIG 109.0 07/29/2018   HDL 54.40  07/29/2018   LDLDIRECT 108.0 04/01/2017   LDLCALC 100 (H) 07/29/2018   ALT 79 (H) 08/10/2018   AST 52 (H) 08/10/2018   NA 142 08/10/2018   K 5.3 (H) 08/10/2018   CL 104 08/10/2018   CREATININE 1.11 08/10/2018   BUN 14 08/10/2018   CO2 30 08/10/2018   TSH 1.44 04/01/2017   PSA 0.28 02/20/2016    DG Chest 2 View  Result Date: 08/17/2015 CLINICAL DATA:  Yearly physical exam, follow-up right lower pulmonary hamartoma EXAM: CHEST  2 VIEW COMPARISON:  11/20/2010 and 01/03/2011 FINDINGS: Cardiomediastinal silhouette is stable. No acute infiltrate or pulmonary edema. Stable right lower lobe posterior medial pulmonary nodular hamartoma measures 3.4 cm. IMPRESSION: No acute infiltrate or pulmonary edema. Stable right lower lobe posterior medial pulmonary nodular hamartoma measures 3.4 cm. Electronically Signed   By: Lahoma Crocker M.D.   On: 08/17/2015 16:45     Assessment & Plan:  Plan  I have discontinued Casyn R. Latini's ALPRAZolam. I am also having him maintain his amphetamine-dextroamphetamine, escitalopram, fenofibrate, and rosuvastatin.  No orders of the defined types were placed in this encounter.   Problem List Items Addressed This Visit      Unprioritized   Ganglion cyst of dorsum of left wrist    Will con't to watch Consider referal ortho-- pt wants to wait       Hamartoma of lung ([place])    Check cxr      Hyperlipidemia, mixed    Check labs Encouraged heart healthy diet, increase exercise, avoid trans fats, consider a krill oil cap daily       Other Visit Diagnoses    Dyslipidemia    -  Primary   Relevant Orders   Lipid panel   Comprehensive metabolic panel   Pulmonary hamartoma [facility])       Relevant Orders   DG Chest 2 View      Follow-up: Return in about 6 months (around 08/21/2020), or if symptoms worsen or fail to improve, for hyperlipidemia, annual exam, fasting.  Ann Held, DO

## 2020-02-23 ENCOUNTER — Other Ambulatory Visit: Payer: Self-pay | Admitting: Family Medicine

## 2020-02-23 ENCOUNTER — Encounter: Payer: Self-pay | Admitting: Family Medicine

## 2020-02-23 DIAGNOSIS — E785 Hyperlipidemia, unspecified: Secondary | ICD-10-CM

## 2020-02-23 LAB — COMPREHENSIVE METABOLIC PANEL
AG Ratio: 1.9 (calc) (ref 1.0–2.5)
ALT: 49 U/L — ABNORMAL HIGH (ref 9–46)
AST: 31 U/L (ref 10–40)
Albumin: 4.9 g/dL (ref 3.6–5.1)
Alkaline phosphatase (APISO): 71 U/L (ref 36–130)
BUN: 15 mg/dL (ref 7–25)
CO2: 30 mmol/L (ref 20–32)
Calcium: 10 mg/dL (ref 8.6–10.3)
Chloride: 101 mmol/L (ref 98–110)
Creat: 1 mg/dL (ref 0.60–1.35)
Globulin: 2.6 g/dL (calc) (ref 1.9–3.7)
Glucose, Bld: 101 mg/dL — ABNORMAL HIGH (ref 65–99)
Potassium: 4.7 mmol/L (ref 3.5–5.3)
Sodium: 143 mmol/L (ref 135–146)
Total Bilirubin: 0.4 mg/dL (ref 0.2–1.2)
Total Protein: 7.5 g/dL (ref 6.1–8.1)

## 2020-02-23 LAB — LIPID PANEL
Cholesterol: 301 mg/dL — ABNORMAL HIGH (ref <200)
HDL: 50 mg/dL (ref >=40)
LDL Cholesterol (Calc): 207 mg/dL (calc) — ABNORMAL HIGH
Non-HDL Cholesterol (Calc): 251 mg/dL (calc) — ABNORMAL HIGH (ref <130)
Total CHOL/HDL Ratio: 6 (calc) — ABNORMAL HIGH (ref <5.0)
Triglycerides: 236 mg/dL — ABNORMAL HIGH (ref <150)

## 2020-02-23 MED ORDER — ROSUVASTATIN CALCIUM 5 MG PO TABS: ORAL_TABLET | ORAL | 1 refills | Status: DC

## 2020-02-23 MED ORDER — FENOFIBRATE 160 MG PO TABS: 160.0000 mg | ORAL_TABLET | Freq: Every day | ORAL | 1 refills | Status: DC

## 2020-03-31 ENCOUNTER — Telehealth: Payer: 59 | Admitting: Nurse Practitioner

## 2020-03-31 DIAGNOSIS — J0100 Acute maxillary sinusitis, unspecified: Secondary | ICD-10-CM | POA: Diagnosis not present

## 2020-03-31 MED ORDER — AMOXICILLIN-POT CLAVULANATE 875-125 MG PO TABS: 1.0000 | ORAL_TABLET | Freq: Two times a day (BID) | ORAL | 0 refills | Status: DC

## 2020-03-31 NOTE — Progress Notes (Signed)

## 2020-08-18 ENCOUNTER — Other Ambulatory Visit: Payer: Self-pay | Admitting: Family Medicine

## 2020-08-18 DIAGNOSIS — E785 Hyperlipidemia, unspecified: Secondary | ICD-10-CM

## 2020-08-21 ENCOUNTER — Other Ambulatory Visit (HOSPITAL_COMMUNITY): Payer: Self-pay | Admitting: Internal Medicine

## 2020-08-21 ENCOUNTER — Ambulatory Visit: Payer: 59 | Attending: Internal Medicine

## 2020-08-21 DIAGNOSIS — Z23 Encounter for immunization: Secondary | ICD-10-CM

## 2020-08-21 MED FILL — PFIZER-BIONT COVID-19 VAC-T: 30 | 21 days supply | Qty: 0 | Fill #0

## 2020-08-21 NOTE — Progress Notes (Signed)
   Covid-19 Vaccination Clinic  Name:  DAKOTA VANWART    MRN: [id number] DOB: 24-Sep-1975  08/21/2020  Mr. Hemphill was observed post Covid-19 immunization for 15 minutes without incident. He was provided with Vaccine Information Sheet and instruction to access the V-Safe system.   Mr. Lipsky was instructed to call 911 with any severe reactions post vaccine: Marland Kitchen Difficulty breathing  . Swelling of face and throat  . A fast heartbeat  . A bad rash all over body  . Dizziness and weakness   Immunizations Administered    Name Date Dose VIS Date Route   PFIZER Comrnaty(Gray TOP) Covid-19 Vaccine 08/21/2020 11:56 AM 0.3 mL 06/01/2020 Intramuscular   Manufacturer: [employer]   Lot: [id number]   NDC: [phone]

## 2020-09-18 ENCOUNTER — Other Ambulatory Visit: Payer: Self-pay | Admitting: Family Medicine

## 2020-09-18 DIAGNOSIS — E785 Hyperlipidemia, unspecified: Secondary | ICD-10-CM

## 2020-10-23 ENCOUNTER — Telehealth: Payer: Self-pay | Admitting: Family Medicine

## 2020-10-23 DIAGNOSIS — E785 Hyperlipidemia, unspecified: Secondary | ICD-10-CM

## 2020-10-23 MED ORDER — ROSUVASTATIN CALCIUM 5 MG PO TABS: 5.0000 mg | ORAL_TABLET | Freq: Every day | ORAL | 0 refills | Status: DC

## 2020-10-23 MED ORDER — FENOFIBRATE 160 MG PO TABS: 160.0000 mg | ORAL_TABLET | Freq: Every day | ORAL | 0 refills | Status: DC

## 2020-10-23 NOTE — Telephone Encounter (Signed)
Medication: rosuvastatin (CRESTOR) 5 MG tablet  fenofibrate 160 MG tablet [[id number]   Has the patient contacted their pharmacy? No. (If no, request that the patient contact the pharmacy for the refill.) (If yes, when and what did the pharmacy advise?)  Preferred Pharmacy (with phone number or street name): [id number] [address], [address]  [place], [place] [address]  Phone:  [phone] Fax:  [phone]  [place] #:  [id number]  Agent: Please be advised that RX refills may take up to 3 business days. We ask that you follow-up with your pharmacy.

## 2020-10-23 NOTE — Telephone Encounter (Signed)
Refill sent.

## 2020-10-24 ENCOUNTER — Other Ambulatory Visit: Payer: Self-pay | Admitting: Family Medicine

## 2020-10-24 DIAGNOSIS — E785 Hyperlipidemia, unspecified: Secondary | ICD-10-CM

## 2020-10-26 ENCOUNTER — Ambulatory Visit: Payer: 59 | Admitting: Family Medicine

## 2020-11-16 ENCOUNTER — Encounter: Payer: Self-pay | Admitting: Family Medicine

## 2020-11-16 ENCOUNTER — Other Ambulatory Visit: Payer: Self-pay

## 2020-11-16 ENCOUNTER — Ambulatory Visit (INDEPENDENT_AMBULATORY_CARE_PROVIDER_SITE_OTHER): Payer: 59 | Admitting: Family Medicine

## 2020-11-16 VITALS — BP 142/98 | HR 88 | Temp 98.2°F | Resp 18 | Ht 70.0 in | Wt 250.4 lb

## 2020-11-16 DIAGNOSIS — Z0000 Encounter for general adult medical examination without abnormal findings: Secondary | ICD-10-CM | POA: Diagnosis not present

## 2020-11-16 DIAGNOSIS — Z1211 Encounter for screening for malignant neoplasm of colon: Secondary | ICD-10-CM

## 2020-11-16 DIAGNOSIS — E785 Hyperlipidemia, unspecified: Secondary | ICD-10-CM

## 2020-11-16 DIAGNOSIS — I10 Essential (primary) hypertension: Secondary | ICD-10-CM

## 2020-11-16 DIAGNOSIS — Z125 Encounter for screening for malignant neoplasm of prostate: Secondary | ICD-10-CM

## 2020-11-16 HISTORY — DX: Essential (primary) hypertension: I10

## 2020-11-16 LAB — LIPID PANEL
Cholesterol: 180 mg/dL (ref 0–200)
HDL: 48.7 mg/dL (ref >39.00)
LDL Cholesterol: 99 mg/dL (ref 0–99)
NonHDL: 131.34
Total CHOL/HDL Ratio: 4
Triglycerides: 160 mg/dL — ABNORMAL HIGH (ref 0.0–149.0)
VLDL: 32 mg/dL (ref 0.0–40.0)

## 2020-11-16 LAB — CBC WITH DIFFERENTIAL/PLATELET
Basophils Absolute: 0 10*3/uL (ref 0.0–0.1)
Basophils Relative: 0.9 % (ref 0.0–3.0)
Eosinophils Absolute: 0 10*3/uL (ref 0.0–0.7)
Eosinophils Relative: 1 % (ref 0.0–5.0)
HCT: 41.9 % (ref 39.0–52.0)
Hemoglobin: 14.4 g/dL (ref 13.0–17.0)
Lymphocytes Relative: 30.2 % (ref 12.0–46.0)
Lymphs Abs: 1.4 10*3/uL (ref 0.7–4.0)
MCHC: 34.4 g/dL (ref 30.0–36.0)
MCV: 89.3 fl (ref 78.0–100.0)
Monocytes Absolute: 0.4 10*3/uL (ref 0.1–1.0)
Monocytes Relative: 9.4 % (ref 3.0–12.0)
Neutro Abs: 2.8 10*3/uL (ref 1.4–7.7)
Neutrophils Relative %: 58.5 % (ref 43.0–77.0)
Platelets: 252 10*3/uL (ref 150.0–400.0)
RBC: 4.69 Mil/uL (ref 4.22–5.81)
RDW: 13.1 % (ref 11.5–15.5)
WBC: 4.8 10*3/uL (ref 4.0–10.5)

## 2020-11-16 LAB — COMPREHENSIVE METABOLIC PANEL
ALT: 66 U/L — ABNORMAL HIGH (ref 0–53)
AST: 38 U/L — ABNORMAL HIGH (ref 0–37)
Albumin: 5.1 g/dL (ref 3.5–5.2)
Alkaline Phosphatase: 52 U/L (ref 39–117)
BUN: 15 mg/dL (ref 6–23)
CO2: 29 mEq/L (ref 19–32)
Calcium: 10.3 mg/dL (ref 8.4–10.5)
Chloride: 101 mEq/L (ref 96–112)
Creatinine, Ser: 1.05 mg/dL (ref 0.40–1.50)
GFR: 85.89 mL/min (ref >60.00)
Glucose, Bld: 104 mg/dL — ABNORMAL HIGH (ref 70–99)
Potassium: 4.5 mEq/L (ref 3.5–5.1)
Sodium: 138 mEq/L (ref 135–145)
Total Bilirubin: 0.6 mg/dL (ref 0.2–1.2)
Total Protein: 7.6 g/dL (ref 6.0–8.3)

## 2020-11-16 LAB — MICROALBUMIN / CREATININE URINE RATIO
Creatinine,U: 42.7 mg/dL
Microalb Creat Ratio: 1.6 mg/g (ref 0.0–30.0)
Microalb, Ur: <0.7 mg/dL (ref 0.0–1.9)

## 2020-11-16 LAB — PSA: PSA: 0.37 ng/mL (ref 0.10–4.00)

## 2020-11-16 LAB — TSH: TSH: 3.46 u[IU]/mL (ref 0.35–4.50)

## 2020-11-16 MED ORDER — LISINOPRIL 10 MG PO TABS: 10.0000 mg | ORAL_TABLET | Freq: Every day | ORAL | 3 refills | Status: DC

## 2020-11-16 NOTE — Assessment & Plan Note (Addendum)
ghm utd Check labs  See avs covid booster today downstairs

## 2020-11-16 NOTE — Progress Notes (Signed)
Subjective:   By signing my name below, I, Kevin Mueller, attest that this documentation has been prepared under the direction and in the presence of Dr. Roma Schanz, DO. 11/16/2020      Patient ID: Kevin Mueller, male    DOB: 13-Jun-1976, 45 y.o.   MRN: [id number]  Chief Complaint  Patient presents with  . Annual Exam    Pt states fasting     HPI Patient is in today for a comprehensive physical exam. He complains of multiple moles on his back. They do not cause him pain or itch at this time. His blood pressure is elevated during this visit. He notes that he stayed up all night yesterday for his work. He denies having any dysuria, hematuria, frequency, headaches or chest pains at this time. His father has a history of squamous cell carcinoma, high blood pressure, fatty liver, high cholesterol, blood sugar issues. His mother has no know history of medical issues at this time.  Immunizations: He has had 3 Covid-19 vaccines at this time. He is UTD on tetanus vaccines at this time.    Past Medical History:  Diagnosis Date  . Elevated blood pressure (not hypertension)   . Hamartoma ([place])   . Hamartoma of lung ([place]) 10/20/2012  . Hyperlipidemia   . IBS (irritable bowel syndrome)   . Insomnia 09/26/2012  . Primary hypertension 11/16/2020    Past Surgical History:  Procedure Laterality Date  . LUNG BIOPSY     FNA under xray guidance 1998 @ [facility]    Family History  Problem Relation Age of Onset  . Diabetes Other   . Hyperlipidemia Other   . Cancer Other        PGM ? digestive  . Kidney disease Other   . Squamous cell carcinoma Father   . High blood pressure Father   . Hyperlipidemia Father     Social History   Socioeconomic History  . Marital status: Married    Spouse name: Not on file  . Number of children: Not on file  . Years of education: Not on file  . Highest education level: Not on file  Occupational History  . Occupation: Soil scientist for [employer]   Tobacco Use  . Smoking status: Former Smoker    Packs/day: 2.00    Types: Cigarettes    Start date: 10/01/2006  . Smokeless tobacco: Never Used  Substance and Sexual Activity  . Alcohol use: Yes    Alcohol/week: 28.0 standard drinks    Types: 28 Cans of beer per week  . Drug use: No  . Sexual activity: Yes    Partners: Female  Other Topics Concern  . Not on file  Social History Narrative   Exercise---  Some weights,  Running after kids   Social Determinants of Health   Financial Resource Strain: Not on file  Food Insecurity: Not on file  Transportation Needs: Not on file  Physical Activity: Not on file  Stress: Not on file  Social Connections: Not on file  Intimate Partner Violence: Not on file    Outpatient Medications Prior to Visit  Medication Sig Dispense Refill  . amphetamine-dextroamphetamine (ADDERALL) 20 MG tablet Take 20 mg by mouth 3 (three) times daily.    Marland Kitchen COVID-19 mRNA Vac-TriS, Pfizer, SUSP injection INJECT AS DIRECTED .3 mL 0  . escitalopram (LEXAPRO) 20 MG tablet Take 1 tablet (20 mg total) by mouth daily.    . fenofibrate 160 MG tablet Take 1 tablet (160  mg total) by mouth daily. Pt due for office visit for further refills 30 tablet 0  . rosuvastatin (CRESTOR) 5 MG tablet Take 1 tablet (5 mg total) by mouth daily. Pt due for office visit for further refills 30 tablet 0  . amoxicillin-clavulanate (AUGMENTIN) 875-125 MG tablet Take 1 tablet by mouth 2 (two) times daily. 14 tablet 0   No facility-administered medications prior to visit.    Allergies  Allergen Reactions  . Codeine     Adverse reaction, per pt  . Simvastatin     Review of Systems  Constitutional: Negative for chills, fever and malaise/fatigue.  HENT: Negative for congestion and hearing loss.   Eyes: Negative for discharge.  Respiratory: Negative for cough, sputum production and shortness of breath.   Cardiovascular: Negative for chest pain, palpitations and leg swelling.   Gastrointestinal: Negative for abdominal pain, blood in stool, constipation, diarrhea, heartburn, nausea and vomiting.  Genitourinary: Negative for dysuria, frequency, hematuria and urgency.  Musculoskeletal: Negative for back pain, falls and myalgias.  Skin: Negative for itching and rash.       (+)multiple moles on back  Neurological: Negative for dizziness, sensory change, loss of consciousness, weakness and headaches.  Endo/Heme/Allergies: Negative for environmental allergies. Does not bruise/bleed easily.  Psychiatric/Behavioral: Negative for depression and suicidal ideas. The patient is not nervous/anxious and does not have insomnia.        Objective:    Physical Exam Vitals and nursing note reviewed.  Constitutional:      General: He is not in acute distress.    Appearance: Normal appearance. He is well-developed. He is not ill-appearing or diaphoretic.  HENT:     Head: Normocephalic and atraumatic.     Right Ear: Tympanic membrane and external ear normal.     Left Ear: Tympanic membrane and external ear normal.     Nose: Nose normal.     Mouth/Throat:     Pharynx: No oropharyngeal exudate.  Eyes:     General:        Right eye: No discharge.        Left eye: No discharge.     Extraocular Movements: Extraocular movements intact.     Conjunctiva/sclera: Conjunctivae normal.     Pupils: Pupils are equal, round, and reactive to light.  Neck:     Thyroid: No thyromegaly.     Vascular: No JVD.  Cardiovascular:     Rate and Rhythm: Normal rate and regular rhythm.     Pulses: Normal pulses.     Heart sounds: Normal heart sounds. No murmur heard. No friction rub. No gallop.   Pulmonary:     Effort: Pulmonary effort is normal. No respiratory distress.     Breath sounds: Normal breath sounds. No wheezing, rhonchi or rales.  Chest:     Chest wall: No tenderness.  Abdominal:     General: Bowel sounds are normal. There is no distension.     Palpations: Abdomen is soft. There  is no mass.     Tenderness: There is no abdominal tenderness. There is no guarding or rebound.     Hernia: No hernia is present.  Genitourinary:    Comments: Pt prefers not to have gu exam  Musculoskeletal:        General: No tenderness. Normal range of motion.     Cervical back: Normal range of motion and neck supple.  Lymphadenopathy:     Cervical: No cervical adenopathy.  Skin:    General: Skin is warm  and dry.     Coloration: Skin is not pale.     Findings: No erythema or rash.  Neurological:     General: No focal deficit present.     Mental Status: He is alert and oriented to person, place, and time.     Motor: No abnormal muscle tone.     Deep Tendon Reflexes: Reflexes are normal and symmetric. Reflexes normal.  Psychiatric:        Mood and Affect: Mood normal.        Behavior: Behavior normal.        Thought Content: Thought content normal.        Judgment: Judgment normal.     BP (!) 142/98 (BP Location: Left Arm, Patient Position: Sitting, Cuff Size: Large)   Pulse 88   Temp 98.2 F (36.8 C) (Oral)   Resp 18   Ht 5\' 10"  (1.778 m)   Wt 250 lb 6.4 oz (113.6 kg)   SpO2 99%   BMI 35.93 kg/m  Wt Readings from Last 3 Encounters:  11/16/20 250 lb 6.4 oz (113.6 kg)  02/22/20 250 lb 3.2 oz (113.5 kg)  07/14/18 248 lb 12.8 oz (112.9 kg)    Diabetic Foot Exam - Simple   No data filed    Lab Results  Component Value Date   WBC 4.7 04/01/2017   HGB 13.9 04/01/2017   HCT 41.5 04/01/2017   PLT 244.0 04/01/2017   GLUCOSE 101 (H) 02/22/2020   CHOL 301 (H) 02/22/2020   TRIG 236 (H) 02/22/2020   HDL 50 02/22/2020   LDLDIRECT 108.0 04/01/2017   LDLCALC 207 (H) 02/22/2020   ALT 49 (H) 02/22/2020   AST 31 02/22/2020   NA 143 02/22/2020   K 4.7 02/22/2020   CL 101 02/22/2020   CREATININE 1.00 02/22/2020   BUN 15 02/22/2020   CO2 30 02/22/2020   TSH 1.44 04/01/2017   PSA 0.28 02/20/2016    Lab Results  Component Value Date   TSH 1.44 04/01/2017   Lab  Results  Component Value Date   WBC 4.7 04/01/2017   HGB 13.9 04/01/2017   HCT 41.5 04/01/2017   MCV 91.0 04/01/2017   PLT 244.0 04/01/2017   Lab Results  Component Value Date   NA 143 02/22/2020   K 4.7 02/22/2020   CO2 30 02/22/2020   GLUCOSE 101 (H) 02/22/2020   BUN 15 02/22/2020   CREATININE 1.00 02/22/2020   BILITOT 0.4 02/22/2020   ALKPHOS 50 08/10/2018   AST 31 02/22/2020   ALT 49 (H) 02/22/2020   PROT 7.5 02/22/2020   ALBUMIN 4.7 08/10/2018   CALCIUM 10.0 02/22/2020   GFR 72.31 08/10/2018   Lab Results  Component Value Date   CHOL 301 (H) 02/22/2020   Lab Results  Component Value Date   HDL 50 02/22/2020   Lab Results  Component Value Date   LDLCALC 207 (H) 02/22/2020   Lab Results  Component Value Date   TRIG 236 (H) 02/22/2020   Lab Results  Component Value Date   CHOLHDL 6.0 (H) 02/22/2020   No results found for: HGBA1C  Colonoscopy- Last completed 04/09/2006. Results normal. Repeat in 10 years. He is due for a colonoscopy a this time. PSA- Not completed. He is willing to get his PSA screening at this time. Dexa- Not completed.    Assessment & Plan:   Problem List Items Addressed This Visit      Unprioritized   Hyperlipidemia    Encouraged  heart healthy diet, increase exercise, avoid trans fats, consider a krill oil cap daily Check labs today      Relevant Medications   lisinopril (ZESTRIL) 10 MG tablet   Other Relevant Orders   Lipid panel   Comprehensive metabolic panel   Preventative health care - Primary    ghm utd Check labs  See avs Forms for [employer] filled out covid booster today downstairs        Relevant Orders   Lipid panel   CBC with Differential/Platelet   PSA   TSH   Comprehensive metabolic panel   Primary hypertension    Poorly controlled will alter medications, encouraged DASH diet, minimize caffeine and obtain adequate sleep. Report concerning symptoms and follow up as directed and as needed  Start  lisinopril daily F/u 2-3 weeks       Relevant Medications   lisinopril (ZESTRIL) 10 MG tablet   Other Relevant Orders   Lipid panel   CBC with Differential/Platelet   PSA   TSH   Comprehensive metabolic panel   Microalbumin / creatinine urine ratio    Other Visit Diagnoses    Colon cancer screening       Relevant Orders   Ambulatory referral to Gastroenterology       Meds ordered this encounter  Medications  . lisinopril (ZESTRIL) 10 MG tablet    Sig: Take 1 tablet (10 mg total) by mouth daily.    Dispense:  30 tablet    Refill:  3    I, Dr. Roma Schanz, DO, personally preformed the services described in this documentation.  All medical record entries made by the scribe were at my direction and in my presence.  I have reviewed the chart and discharge instructions (if applicable) and agree that the record reflects my personal performance and is accurate and complete. 11/16/2020   I,Kevin Mueller,acting as a scribe for Ann Held, DO.,have documented all relevant documentation on the behalf of Ann Held, DO,as directed by  Ann Held, DO while in the presence of Ann Held, DO.   Ann Held, DO

## 2020-11-16 NOTE — Assessment & Plan Note (Signed)
Encouraged heart healthy diet, increase exercise, avoid trans fats, consider a krill oil cap daily Check labs today

## 2020-11-16 NOTE — Patient Instructions (Signed)

## 2020-11-16 NOTE — Assessment & Plan Note (Signed)
Poorly controlled will alter medications, encouraged DASH diet, minimize caffeine and obtain adequate sleep. Report concerning symptoms and follow up as directed and as needed  Start lisinopril daily F/u 2-3 weeks

## 2020-11-17 ENCOUNTER — Other Ambulatory Visit: Payer: Self-pay

## 2020-11-17 ENCOUNTER — Other Ambulatory Visit: Payer: Self-pay | Admitting: Family Medicine

## 2020-11-17 DIAGNOSIS — E785 Hyperlipidemia, unspecified: Secondary | ICD-10-CM

## 2020-11-17 MED ORDER — ROSUVASTATIN CALCIUM 10 MG PO TABS: ORAL_TABLET | ORAL | 2 refills | Status: DC

## 2020-11-21 ENCOUNTER — Other Ambulatory Visit: Payer: Self-pay | Admitting: Family Medicine

## 2020-11-21 ENCOUNTER — Encounter: Payer: Self-pay | Admitting: Family Medicine

## 2020-11-21 DIAGNOSIS — E785 Hyperlipidemia, unspecified: Secondary | ICD-10-CM

## 2020-11-21 MED ORDER — FENOFIBRATE 160 MG PO TABS: 160.0000 mg | ORAL_TABLET | Freq: Every day | ORAL | 2 refills | Status: DC

## 2020-11-24 ENCOUNTER — Other Ambulatory Visit: Payer: Self-pay | Admitting: Family Medicine

## 2020-11-24 DIAGNOSIS — E785 Hyperlipidemia, unspecified: Secondary | ICD-10-CM

## 2020-12-04 ENCOUNTER — Ambulatory Visit (INDEPENDENT_AMBULATORY_CARE_PROVIDER_SITE_OTHER): Payer: 59 | Admitting: Family Medicine

## 2020-12-04 ENCOUNTER — Encounter: Payer: Self-pay | Admitting: Family Medicine

## 2020-12-04 ENCOUNTER — Other Ambulatory Visit: Payer: Self-pay

## 2020-12-04 VITALS — BP 120/76 | HR 72 | Temp 97.8°F | Resp 18 | Ht 70.0 in | Wt 250.4 lb

## 2020-12-04 DIAGNOSIS — R748 Abnormal levels of other serum enzymes: Secondary | ICD-10-CM | POA: Diagnosis not present

## 2020-12-04 DIAGNOSIS — Z3009 Encounter for other general counseling and advice on contraception: Secondary | ICD-10-CM | POA: Diagnosis not present

## 2020-12-04 DIAGNOSIS — I10 Essential (primary) hypertension: Secondary | ICD-10-CM | POA: Diagnosis not present

## 2020-12-04 DIAGNOSIS — E785 Hyperlipidemia, unspecified: Secondary | ICD-10-CM

## 2020-12-04 LAB — COMPREHENSIVE METABOLIC PANEL
ALT: 67 U/L — ABNORMAL HIGH (ref 0–53)
AST: 42 U/L — ABNORMAL HIGH (ref 0–37)
Albumin: 4.7 g/dL (ref 3.5–5.2)
Alkaline Phosphatase: 49 U/L (ref 39–117)
BUN: 18 mg/dL (ref 6–23)
CO2: 27 mEq/L (ref 19–32)
Calcium: 9.4 mg/dL (ref 8.4–10.5)
Chloride: 106 mEq/L (ref 96–112)
Creatinine, Ser: 1 mg/dL (ref 0.40–1.50)
GFR: 91.04 mL/min (ref >60.00)
Glucose, Bld: 105 mg/dL — ABNORMAL HIGH (ref 70–99)
Potassium: 4.9 mEq/L (ref 3.5–5.1)
Sodium: 141 mEq/L (ref 135–145)
Total Bilirubin: 0.7 mg/dL (ref 0.2–1.2)
Total Protein: 7 g/dL (ref 6.0–8.3)

## 2020-12-04 MED ORDER — FENOFIBRATE 160 MG PO TABS: 160.0000 mg | ORAL_TABLET | Freq: Every day | ORAL | 1 refills | Status: DC

## 2020-12-04 MED ORDER — LISINOPRIL 10 MG PO TABS: 10.0000 mg | ORAL_TABLET | Freq: Every day | ORAL | 1 refills | Status: DC

## 2020-12-04 MED ORDER — ROSUVASTATIN CALCIUM 10 MG PO TABS: ORAL_TABLET | ORAL | 1 refills | Status: DC

## 2020-12-04 NOTE — Patient Instructions (Signed)
https://www.nhlbi.nih.gov/files/docs/public/heart/dash_brief.pdf">  DASH Eating Plan DASH stands for Dietary Approaches to Stop Hypertension. The DASH eating plan is a healthy eating plan that has been shown to: Reduce high blood pressure (hypertension). Reduce your risk for type 2 diabetes, heart disease, and stroke. Help with weight loss. What are tips for following this plan? Reading food labels Check food labels for the amount of salt (sodium) per serving. Choose foods with less than 5 percent of the Daily Value of sodium. Generally, foods with less than 300 milligrams (mg) of sodium per serving fit into this eating plan. To find whole grains, look for the word "whole" as the first word in the ingredient list. Shopping Buy products labeled as "low-sodium" or "no salt added." Buy fresh foods. Avoid canned foods and pre-made or frozen meals. Cooking Avoid adding salt when cooking. Use salt-free seasonings or herbs instead of table salt or sea salt. Check with your health care provider or pharmacist before using salt substitutes. Do not fry foods. Cook foods using healthy methods such as baking, boiling, grilling, roasting, and broiling instead. Cook with heart-healthy oils, such as olive, canola, avocado, soybean, or sunflower oil. Meal planning  Eat a balanced diet that includes: 4 or more servings of fruits and 4 or more servings of vegetables each day. Try to fill one-half of your plate with fruits and vegetables. 6-8 servings of whole grains each day. Less than 6 oz (170 g) of lean meat, poultry, or fish each day. A 3-oz (85-g) serving of meat is about the same size as a deck of cards. One egg equals 1 oz (28 g). 2-3 servings of low-fat dairy each day. One serving is 1 cup (237 mL). 1 serving of nuts, seeds, or beans 5 times each week. 2-3 servings of heart-healthy fats. Healthy fats called omega-3 fatty acids are found in foods such as walnuts, flaxseeds, fortified milks, and eggs.  These fats are also found in cold-water fish, such as sardines, salmon, and mackerel. Limit how much you eat of: Canned or prepackaged foods. Food that is high in trans fat, such as some fried foods. Food that is high in saturated fat, such as fatty meat. Desserts and other sweets, sugary drinks, and other foods with added sugar. Full-fat dairy products. Do not salt foods before eating. Do not eat more than 4 egg yolks a week. Try to eat at least 2 vegetarian meals a week. Eat more home-cooked food and less restaurant, buffet, and fast food.  Lifestyle When eating at a restaurant, ask that your food be prepared with less salt or no salt, if possible. If you drink alcohol: Limit how much you use to: 0-1 drink a day for women who are not pregnant. 0-2 drinks a day for men. Be aware of how much alcohol is in your drink. In the [place] one drink equals one 12 oz bottle of beer (355 mL), one 5 oz glass of wine (148 mL), or one 1 oz glass of hard liquor (44 mL). General information Avoid eating more than 2,300 mg of salt a day. If you have hypertension, you may need to reduce your sodium intake to 1,500 mg a day. Work with your health care provider to maintain a healthy body weight or to lose weight. Ask what an ideal weight is for you. Get at least 30 minutes of exercise that causes your heart to beat faster (aerobic exercise) most days of the week. Activities may include walking, swimming, or biking. Work with your health care provider   or dietitian to adjust your eating plan to your individual calorie needs. What foods should I eat? Fruits All fresh, dried, or frozen fruit. Canned fruit in natural juice (without addedsugar). Vegetables Fresh or frozen vegetables (raw, steamed, roasted, or grilled). Low-sodium or reduced-sodium tomato and vegetable juice. Low-sodium or reduced-sodium tomatosauce and tomato paste. Low-sodium or reduced-sodium canned vegetables. Grains Whole-grain or  whole-wheat bread. Whole-grain or whole-wheat pasta. Brown rice. Oatmeal. Quinoa. Bulgur. Whole-grain and low-sodium cereals. Pita bread.Low-fat, low-sodium crackers. Whole-wheat flour tortillas. Meats and other proteins Skinless chicken or turkey. Ground chicken or turkey. Pork with fat trimmed off. Fish and seafood. Egg whites. Dried beans, peas, or lentils. Unsalted nuts, nut butters, and seeds. Unsalted canned beans. Lean cuts of beef with fat trimmed off. Low-sodium, lean precooked or cured meat, such as sausages or meatloaves. Dairy Low-fat (1%) or fat-free (skim) milk. Reduced-fat, low-fat, or fat-free cheeses. Nonfat, low-sodium ricotta or cottage cheese. Low-fat or nonfatyogurt. Low-fat, low-sodium cheese. Fats and oils Soft margarine without trans fats. Vegetable oil. Reduced-fat, low-fat, or light mayonnaise and salad dressings (reduced-sodium). Canola, safflower, olive, avocado, soybean, andsunflower oils. Avocado. Seasonings and condiments Herbs. Spices. Seasoning mixes without salt. Other foods Unsalted popcorn and pretzels. Fat-free sweets. The items listed above may not be a complete list of foods and beverages you can eat. Contact a dietitian for more information. What foods should I avoid? Fruits Canned fruit in a light or heavy syrup. Fried fruit. Fruit in cream or buttersauce. Vegetables Creamed or fried vegetables. Vegetables in a cheese sauce. Regular canned vegetables (not low-sodium or reduced-sodium). Regular canned tomato sauce and paste (not low-sodium or reduced-sodium). Regular tomato and vegetable juice(not low-sodium or reduced-sodium). Pickles. Olives. Grains Baked goods made with fat, such as croissants, muffins, or some breads. Drypasta or rice meal packs. Meats and other proteins Fatty cuts of meat. Ribs. Fried meat. Bacon. Bologna, salami, and other precooked or cured meats, such as sausages or meat loaves. Fat from the back of a pig (fatback). Bratwurst.  Salted nuts and seeds. Canned beans with added salt. Canned orsmoked fish. Whole eggs or egg yolks. Chicken or turkey with skin. Dairy Whole or 2% milk, cream, and half-and-half. Whole or full-fat cream cheese. Whole-fat or sweetened yogurt. Full-fat cheese. Nondairy creamers. Whippedtoppings. Processed cheese and cheese spreads. Fats and oils Butter. Stick margarine. Lard. Shortening. Ghee. Bacon fat. Tropical oils, suchas coconut, palm kernel, or palm oil. Seasonings and condiments Onion salt, garlic salt, seasoned salt, table salt, and sea salt. Worcestershire sauce. Tartar sauce. Barbecue sauce. Teriyaki sauce. Soy sauce, including reduced-sodium. Steak sauce. Canned and packaged gravies. Fish sauce. Oyster sauce. Cocktail sauce. Store-bought horseradish. Ketchup. Mustard. Meat flavorings and tenderizers. Bouillon cubes. Hot sauces. Pre-made or packaged marinades. Pre-made or packaged taco seasonings. Relishes. Regular saladdressings. Other foods Salted popcorn and pretzels. The items listed above may not be a complete list of foods and beverages you should avoid. Contact a dietitian for more information. Where to find more information National Heart, Lung, and Blood Institute: www.nhlbi.nih.gov American Heart Association: www.heart.org Academy of Nutrition and Dietetics: www.eatright.org National Kidney Foundation: www.kidney.org Summary The DASH eating plan is a healthy eating plan that has been shown to reduce high blood pressure (hypertension). It may also reduce your risk for type 2 diabetes, heart disease, and stroke. When on the DASH eating plan, aim to eat more fresh fruits and vegetables, whole grains, lean proteins, low-fat dairy, and heart-healthy fats. With the DASH eating plan, you should limit salt (sodium) intake to 2,300   mg a day. If you have hypertension, you may need to reduce your sodium intake to 1,500 mg a day. Work with your health care provider or dietitian to adjust  your eating plan to your individual calorie needs. This information is not intended to replace advice given to you by your health care provider. Make sure you discuss any questions you have with your healthcare provider. Document Revised: 05/14/2019 Document Reviewed: 05/14/2019 Elsevier Patient Education  2022 Elsevier Inc.  

## 2020-12-04 NOTE — Progress Notes (Signed)
Patient ID: Kevin Mueller, male    DOB: Dec 08, 1975  Age: 45 y.o. MRN: [id number]    Subjective:  Subjective  HPI Kevin Mueller presents for an office visit and BP follow up today accompanied by his wife. He reports feeling well and has no complaints. He notes having a FMHx of HTN. He endorses taking 10 mg of lisinopril PO Daily for his dx of HTN. He notes that he has been receiving more sleep.  BP Readings from Last 3 Encounters:  12/04/20 120/76  11/16/20 (!) 142/98  02/22/20 (!) 160/100  He reports taking 10 mg of Crestor PO Daily and 160 mg of fenofibrate PO Daily for his dx of HLD.  Lab Results  Component Value Date   CHOL 180 11/16/2020   CHOL 301 (H) 02/22/2020   CHOL 176 07/29/2018   Lab Results  Component Value Date   HDL 48.70 11/16/2020   HDL 50 02/22/2020   HDL 54.40 07/29/2018   Lab Results  Component Value Date   LDLCALC 99 11/16/2020   LDLCALC 207 (H) 02/22/2020   LDLCALC 100 (H) 07/29/2018   Lab Results  Component Value Date   TRIG 160.0 (H) 11/16/2020   TRIG 236 (H) 02/22/2020   TRIG 109.0 07/29/2018   Lab Results  Component Value Date   CHOLHDL 4 11/16/2020   CHOLHDL 6.0 (H) 02/22/2020   CHOLHDL 3 07/29/2018   Lab Results  Component Value Date   LDLDIRECT 108.0 04/01/2017   LDLDIRECT 115.0 02/20/2016   LDLDIRECT 93.5 06/08/2010  He denies any chest pain, SOB, fever, abdominal pain, cough, chills, sore throat, dysuria, urinary incontinence, back pain, HA, or N/V/D at this time. He reports that he is planning on receiving a vasectomy.   Review of Systems  Constitutional:  Negative for chills, fatigue and fever.  HENT:  Negative for ear pain, rhinorrhea, sinus pressure, sinus pain, sore throat and tinnitus.   Eyes:  Negative for pain.  Respiratory:  Negative for cough, shortness of breath and wheezing.   Cardiovascular:  Negative for chest pain.  Gastrointestinal:  Negative for abdominal pain, anal bleeding, constipation, diarrhea, nausea and  vomiting.  Genitourinary:  Negative for flank pain.  Musculoskeletal:  Negative for back pain and neck pain.  Skin:  Negative for rash.  Neurological:  Negative for seizures, weakness, light-headedness, numbness and headaches.   History Past Medical History:  Diagnosis Date   Elevated blood pressure (not hypertension)    Hamartoma (HCC)    Hamartoma of lung ([place]) 10/20/2012   Hyperlipidemia    IBS (irritable bowel syndrome)    Insomnia 09/26/2012   Primary hypertension 11/16/2020    He has a past surgical history that includes Lung biopsy.   His family history includes Cancer in an other family member; Diabetes in an other family member; High blood pressure in his father; Hyperlipidemia in his father and another family member; Kidney disease in an other family member; Squamous cell carcinoma in his father.He reports that he has quit smoking. His smoking use included cigarettes. He started smoking about 14 years ago. He smoked an average of 2.00 packs per day. He has never used smokeless tobacco. He reports current alcohol use of about 28.0 standard drinks of alcohol per week. He reports that he does not use drugs.  Current Outpatient Medications on File Prior to Visit  Medication Sig Dispense Refill   amphetamine-dextroamphetamine (ADDERALL) 20 MG tablet Take 20 mg by mouth 3 (three) times daily.     COVID-19  mRNA Vac-TriS, Pfizer, SUSP injection INJECT AS DIRECTED .3 mL 0   escitalopram (LEXAPRO) 20 MG tablet Take 1 tablet (20 mg total) by mouth daily.     KRILL OIL PO Take by mouth.     No current facility-administered medications on file prior to visit.     Objective:  Objective  Physical Exam Vitals and nursing note reviewed.  Constitutional:      General: He is not in acute distress.    Appearance: Normal appearance. He is well-developed. He is not ill-appearing.  HENT:     Head: Normocephalic and atraumatic.     Right Ear: External ear normal.     Left Ear: External ear  normal.     Nose: Nose normal.  Eyes:     General:        Right eye: No discharge.        Left eye: No discharge.     Extraocular Movements: Extraocular movements intact.     Pupils: Pupils are equal, round, and reactive to light.  Cardiovascular:     Rate and Rhythm: Normal rate and regular rhythm.     Pulses: Normal pulses.     Heart sounds: Normal heart sounds. No murmur heard.   No friction rub. No gallop.  Pulmonary:     Effort: Pulmonary effort is normal. No respiratory distress.     Breath sounds: Normal breath sounds. No stridor. No wheezing, rhonchi or rales.  Chest:     Chest wall: No tenderness.  Abdominal:     General: Bowel sounds are normal. There is no distension.     Palpations: Abdomen is soft. There is no mass.     Tenderness: There is no abdominal tenderness. There is no guarding or rebound.     Hernia: No hernia is present.  Musculoskeletal:        General: Normal range of motion.     Cervical back: Normal range of motion and neck supple.     Right lower leg: No edema.     Left lower leg: No edema.  Skin:    General: Skin is warm and dry.  Neurological:     Mental Status: He is alert and oriented to person, place, and time.  Psychiatric:        Behavior: Behavior normal.        Thought Content: Thought content normal.   BP 120/76 (BP Location: Left Arm, Patient Position: Sitting, Cuff Size: Large)   Pulse 72   Temp 97.8 F (36.6 C) (Oral)   Resp 18   Ht 5\' 10"  (1.778 m)   Wt 250 lb 6.4 oz (113.6 kg)   SpO2 96%   BMI 35.93 kg/m  Wt Readings from Last 3 Encounters:  12/04/20 250 lb 6.4 oz (113.6 kg)  11/16/20 250 lb 6.4 oz (113.6 kg)  02/22/20 250 lb 3.2 oz (113.5 kg)     Lab Results  Component Value Date   WBC 4.8 11/16/2020   HGB 14.4 11/16/2020   HCT 41.9 11/16/2020   PLT 252.0 11/16/2020   GLUCOSE 104 (H) 11/16/2020   CHOL 180 11/16/2020   TRIG 160.0 (H) 11/16/2020   HDL 48.70 11/16/2020   LDLDIRECT 108.0 04/01/2017   LDLCALC 99  11/16/2020   ALT 66 (H) 11/16/2020   AST 38 (H) 11/16/2020   NA 138 11/16/2020   K 4.5 11/16/2020   CL 101 11/16/2020   CREATININE 1.05 11/16/2020   BUN 15 11/16/2020   CO2 29 11/16/2020  TSH 3.46 11/16/2020   PSA 0.37 11/16/2020   MICROALBUR <0.7 11/16/2020    DG Chest 2 View  Result Date: 08/17/2015 CLINICAL DATA:  Yearly physical exam, follow-up right lower pulmonary hamartoma EXAM: CHEST  2 VIEW COMPARISON:  11/20/2010 and 01/03/2011 FINDINGS: Cardiomediastinal silhouette is stable. No acute infiltrate or pulmonary edema. Stable right lower lobe posterior medial pulmonary nodular hamartoma measures 3.4 cm. IMPRESSION: No acute infiltrate or pulmonary edema. Stable right lower lobe posterior medial pulmonary nodular hamartoma measures 3.4 cm. Electronically Signed   By: Lahoma Crocker M.D.   On: 08/17/2015 16:45     Assessment & Plan:  Plan    Meds ordered this encounter  Medications   fenofibrate 160 MG tablet    Sig: Take 1 tablet (160 mg total) by mouth daily.    Dispense:  90 tablet    Refill:  1   lisinopril (ZESTRIL) 10 MG tablet    Sig: Take 1 tablet (10 mg total) by mouth daily.    Dispense:  90 tablet    Refill:  1   rosuvastatin (CRESTOR) 10 MG tablet    Sig: Take 1 tab daily.    Dispense:  90 tablet    Refill:  1    Problem List Items Addressed This Visit       Unprioritized   Hyperlipidemia    Encourage heart healthy diet such as MIND or DASH diet, increase exercise, avoid trans fats, simple carbohydrates and processed foods, consider a krill or fish or flaxseed oil cap daily.        Relevant Medications   fenofibrate 160 MG tablet   lisinopril (ZESTRIL) 10 MG tablet   rosuvastatin (CRESTOR) 10 MG tablet   Primary hypertension    Well controlled, no changes to meds. Encouraged heart healthy diet such as the DASH diet and exercise as tolerated.        Relevant Medications   fenofibrate 160 MG tablet   lisinopril (ZESTRIL) 10 MG tablet    rosuvastatin (CRESTOR) 10 MG tablet   Other Visit Diagnoses     Elevated liver enzymes    -  Primary   Relevant Orders   Comprehensive metabolic panel   Vasectomy evaluation       Relevant Orders   Ambulatory referral to Urology   Hyperlipidemia LDL goal <100       Relevant Medications   fenofibrate 160 MG tablet   lisinopril (ZESTRIL) 10 MG tablet   rosuvastatin (CRESTOR) 10 MG tablet       Follow-up: Return in about 6 months (around 06/05/2021), or if symptoms worsen or fail to improve, for hypertension, hyperlipidemia.    I,Gordon Zheng,acting as a Education administrator for [employer], DO.,have documented all relevant documentation on the behalf of Ann Held, DO,as directed by  Ann Held, DO while in the presence of [place], DO, have reviewed all documentation for this visit. The documentation on 12/04/20 for the exam, diagnosis, procedures, and orders are all accurate and complete.

## 2020-12-04 NOTE — Assessment & Plan Note (Signed)
Well controlled, no changes to meds. Encouraged heart healthy diet such as the DASH diet and exercise as tolerated.  °

## 2020-12-04 NOTE — Assessment & Plan Note (Signed)
Encourage heart healthy diet such as MIND or DASH diet, increase exercise, avoid trans fats, simple carbohydrates and processed foods, consider a krill or fish or flaxseed oil cap daily.  °

## 2020-12-05 ENCOUNTER — Other Ambulatory Visit: Payer: Self-pay | Admitting: Family Medicine

## 2020-12-05 DIAGNOSIS — R748 Abnormal levels of other serum enzymes: Secondary | ICD-10-CM

## 2021-01-17 ENCOUNTER — Ambulatory Visit: Payer: 59 | Attending: Internal Medicine

## 2021-01-17 ENCOUNTER — Other Ambulatory Visit (INDEPENDENT_AMBULATORY_CARE_PROVIDER_SITE_OTHER): Payer: 59

## 2021-01-17 ENCOUNTER — Other Ambulatory Visit: Payer: Self-pay

## 2021-01-17 DIAGNOSIS — Z23 Encounter for immunization: Secondary | ICD-10-CM

## 2021-01-17 DIAGNOSIS — E785 Hyperlipidemia, unspecified: Secondary | ICD-10-CM

## 2021-01-17 LAB — COMPREHENSIVE METABOLIC PANEL
ALT: 49 U/L (ref 0–53)
AST: 30 U/L (ref 0–37)
Albumin: 4.6 g/dL (ref 3.5–5.2)
Alkaline Phosphatase: 46 U/L (ref 39–117)
BUN: 20 mg/dL (ref 6–23)
CO2: 30 mEq/L (ref 19–32)
Calcium: 9.7 mg/dL (ref 8.4–10.5)
Chloride: 104 mEq/L (ref 96–112)
Creatinine, Ser: 1.25 mg/dL (ref 0.40–1.50)
GFR: 69.6 mL/min (ref >60.00)
Glucose, Bld: 102 mg/dL — ABNORMAL HIGH (ref 70–99)
Potassium: 5.3 mEq/L — ABNORMAL HIGH (ref 3.5–5.1)
Sodium: 140 mEq/L (ref 135–145)
Total Bilirubin: 0.5 mg/dL (ref 0.2–1.2)
Total Protein: 7.1 g/dL (ref 6.0–8.3)

## 2021-01-17 LAB — LIPID PANEL
Cholesterol: 160 mg/dL (ref 0–200)
HDL: 46.6 mg/dL (ref >39.00)
LDL Cholesterol: 87 mg/dL (ref 0–99)
NonHDL: 113.21
Total CHOL/HDL Ratio: 3
Triglycerides: 132 mg/dL (ref 0.0–149.0)
VLDL: 26.4 mg/dL (ref 0.0–40.0)

## 2021-01-17 NOTE — Progress Notes (Signed)
   Covid-19 Vaccination Clinic  Name:  Kevin Mueller    MRN: [id number] DOB: 1976-01-03  01/17/2021  [name] was observed post Covid-19 immunization for 15 minutes without incident. He was provided with Vaccine Information Sheet and instruction to access the V-Safe system.   [name] was instructed to call 911 with any severe reactions post vaccine: Difficulty breathing  Swelling of face and throat  A fast heartbeat  A bad rash all over body  Dizziness and weakness   Immunizations Administered     Name Date Dose VIS Date Route   PFIZER Comrnaty(Gray TOP) Covid-19 Vaccine 01/17/2021  9:26 AM 0.3 mL 06/01/2020 Intramuscular   Manufacturer: [place]   Lot: [id number]   [place]: [phone]

## 2021-01-22 ENCOUNTER — Other Ambulatory Visit: Payer: Self-pay

## 2021-01-22 DIAGNOSIS — E875 Hyperkalemia: Secondary | ICD-10-CM

## 2021-01-23 ENCOUNTER — Other Ambulatory Visit (HOSPITAL_BASED_OUTPATIENT_CLINIC_OR_DEPARTMENT_OTHER): Payer: Self-pay

## 2021-01-23 MED ORDER — COVID-19 MRNA VAC-TRIS(PFIZER) 30 MCG/0.3ML IM SUSP
INTRAMUSCULAR | 0 refills | Status: DC
  Filled 2021-01-23: qty 0.3, 1d supply, fill #0

## 2021-02-12 ENCOUNTER — Other Ambulatory Visit: Payer: Self-pay | Admitting: Family Medicine

## 2021-02-12 DIAGNOSIS — E785 Hyperlipidemia, unspecified: Secondary | ICD-10-CM

## 2021-03-13 ENCOUNTER — Other Ambulatory Visit: Payer: Self-pay | Admitting: *Deleted

## 2021-03-13 DIAGNOSIS — E785 Hyperlipidemia, unspecified: Secondary | ICD-10-CM

## 2021-03-13 MED ORDER — FENOFIBRATE 160 MG PO TABS: 160.0000 mg | ORAL_TABLET | Freq: Every day | ORAL | 0 refills | Status: DC

## 2021-03-28 ENCOUNTER — Other Ambulatory Visit: Payer: Self-pay | Admitting: Family Medicine

## 2021-03-28 DIAGNOSIS — I10 Essential (primary) hypertension: Secondary | ICD-10-CM

## 2021-06-05 ENCOUNTER — Other Ambulatory Visit: Payer: Self-pay | Admitting: Family Medicine

## 2021-06-05 DIAGNOSIS — E785 Hyperlipidemia, unspecified: Secondary | ICD-10-CM

## 2021-06-10 ENCOUNTER — Other Ambulatory Visit: Payer: Self-pay | Admitting: Family Medicine

## 2021-06-10 DIAGNOSIS — E785 Hyperlipidemia, unspecified: Secondary | ICD-10-CM

## 2021-07-30 ENCOUNTER — Telehealth: Payer: Self-pay | Admitting: Family Medicine

## 2021-07-30 NOTE — Telephone Encounter (Signed)
Pt stated he has talked with dr. Etter Sjogren about getting his rx filled by her instead of his physiatrist. He stated [facility] not [facility] adder[facility] and only the extended release but he is okay with that. Pt is scheduled 2/14 to discuss this, as he has not been seen since June. Please advise if pt will be able to get rx before 2/14 as he is completely out.   Medication: amphetamine-dextroamphetamine (ADDERALL) 20 MG tablet   Has the patient contacted their pharmacy? Yes.     Preferred Pharmacy: [facility] [address], [place], [place] [address] [phone]

## 2021-07-31 MED ORDER — AMPHETAMINE-DEXTROAMPHETAMINE 20 MG PO TABS: 20.0000 mg | ORAL_TABLET | Freq: Three times a day (TID) | ORAL | 0 refills | Status: DC

## 2021-07-31 NOTE — Telephone Encounter (Signed)
DOD: Please advise 

## 2021-07-31 NOTE — Telephone Encounter (Signed)
7 d sent, further refills to be managed by reg PCP.

## 2021-08-01 NOTE — Telephone Encounter (Signed)
Left detailed message that medication was sent in.

## 2021-08-07 ENCOUNTER — Encounter: Payer: Self-pay | Admitting: Family Medicine

## 2021-08-07 ENCOUNTER — Other Ambulatory Visit: Payer: Self-pay | Admitting: Family Medicine

## 2021-08-07 ENCOUNTER — Ambulatory Visit (INDEPENDENT_AMBULATORY_CARE_PROVIDER_SITE_OTHER): Payer: 59 | Admitting: Family Medicine

## 2021-08-07 VITALS — BP 142/97 | HR 76 | Temp 97.6°F | Resp 12 | Ht 70.0 in | Wt 251.0 lb

## 2021-08-07 DIAGNOSIS — F418 Other specified anxiety disorders: Secondary | ICD-10-CM

## 2021-08-07 DIAGNOSIS — E785 Hyperlipidemia, unspecified: Secondary | ICD-10-CM | POA: Diagnosis not present

## 2021-08-07 DIAGNOSIS — I10 Essential (primary) hypertension: Secondary | ICD-10-CM

## 2021-08-07 DIAGNOSIS — F411 Generalized anxiety disorder: Secondary | ICD-10-CM | POA: Diagnosis not present

## 2021-08-07 DIAGNOSIS — F988 Other specified behavioral and emotional disorders with onset usually occurring in childhood and adolescence: Secondary | ICD-10-CM

## 2021-08-07 MED ORDER — ARIPIPRAZOLE 2 MG PO TABS: 2.0000 mg | ORAL_TABLET | Freq: Every day | ORAL | 1 refills | Status: DC

## 2021-08-07 MED ORDER — FENOFIBRATE 160 MG PO TABS: ORAL_TABLET | ORAL | 0 refills | Status: DC

## 2021-08-07 MED ORDER — AMPHETAMINE-DEXTROAMPHETAMINE 20 MG PO TABS: 20.0000 mg | ORAL_TABLET | Freq: Three times a day (TID) | ORAL | 0 refills | Status: DC

## 2021-08-07 MED ORDER — ALPRAZOLAM 0.25 MG PO TABS: 0.2500 mg | ORAL_TABLET | Freq: Two times a day (BID) | ORAL | 0 refills | Status: AC | PRN

## 2021-08-07 MED ORDER — LISINOPRIL 10 MG PO TABS: ORAL_TABLET | ORAL | 1 refills | Status: DC

## 2021-08-07 MED ORDER — ESCITALOPRAM OXALATE 20 MG PO TABS: 20.0000 mg | ORAL_TABLET | Freq: Every day | ORAL | 1 refills | Status: DC

## 2021-08-07 MED ORDER — ROSUVASTATIN CALCIUM 10 MG PO TABS: ORAL_TABLET | ORAL | 1 refills | Status: DC

## 2021-08-07 NOTE — Progress Notes (Addendum)
Subjective:   By signing my name below, I, [name], attest that this documentation has been prepared under the direction and in the presence of Ann Held, DO  08/07/2021      Patient ID: Kevin Mueller, male    DOB: 05-Dec-1975, 46 y.o.   MRN: [id number]  Chief Complaint  Patient presents with   Hypertension   Hyperlipidemia   would like to transfer medications from pysch    Hypertension Pertinent negatives include no blurred vision, chest pain, headaches, malaise/fatigue, palpitations or shortness of breath.  Hyperlipidemia Pertinent negatives include no chest pain or shortness of breath.  Patient is in today for a office visit. He is present with his wife during this visit.  He is having thoughts of hurting himself. He has a history of depression. He has stopped taking all of his medication. He continuous seeing a psychiatrist but reports his pharmacy stopped writing prescriptions sent in by them. He is interested in seeing a behavioral health specialist. He has tried Wellbutrin and lexapro in the past and found Wellbutrin helped but caused him to be dull at work so he stopped taking it. He reports being on and off of lexapro. He continues taking 20 mg adderall daily PO to manage his ADHD. He is also requesting to refill his xanax prescription. He is requesting a refill on 160 mg fenofibrate daily PO, 10 mg Zestril daily PO, 10 mg Crestor daily PO.  His wife is inquiring if he qualifies to take ozempic for weight loss.    Past Medical History:  Diagnosis Date   Elevated blood pressure (not hypertension)    Hamartoma (HCC)    Hamartoma of lung ([place]) 10/20/2012   Hyperlipidemia    IBS (irritable bowel syndrome)    Insomnia 09/26/2012   Primary hypertension 11/16/2020    Past Surgical History:  Procedure Laterality Date   LUNG BIOPSY     FNA under xray guidance 1998 @ [facility]    Family History  Problem Relation Age of Onset   Diabetes Other    Hyperlipidemia  Other    Cancer Other        PGM ? digestive   Kidney disease Other    Squamous cell carcinoma Father    High blood pressure Father    Hyperlipidemia Father     Social History   Socioeconomic History   Marital status: Married    Spouse name: Not on file   Number of children: Not on file   Years of education: Not on file   Highest education level: Not on file  Occupational History   Occupation: [employer]   Tobacco Use   Smoking status: Former    Packs/day: 2.00    Types: Cigarettes    Start date: 10/01/2006   Smokeless tobacco: Never  Substance and Sexual Activity   Alcohol use: Yes    Alcohol/week: 28.0 standard drinks    Types: 28 Cans of beer per week   Drug use: No   Sexual activity: Yes    Partners: Female  Other Topics Concern   Not on file  Social History Narrative   Exercise---  Some weights,  Running after kids   Social Determinants of Health   Financial Resource Strain: Not on file  Food Insecurity: Not on file  Transportation Needs: Not on file  Physical Activity: Not on file  Stress: Not on file  Social Connections: Not on file  Intimate Partner Violence: Not on file  Outpatient Medications Prior to Visit  Medication Sig Dispense Refill   KRILL OIL PO Take by mouth.     amphetamine-dextroamphetamine (ADDERALL) 20 MG tablet Take 1 tablet (20 mg total) by mouth 3 (three) times daily for 7 days. 21 tablet 0   escitalopram (LEXAPRO) 20 MG tablet Take 1 tablet (20 mg total) by mouth daily.     COVID-19 mRNA Vac-TriS, Pfizer, SUSP injection INJECT AS DIRECTED .3 mL 0   COVID-19 mRNA Vac-TriS, Pfizer, SUSP injection Inject into the muscle. 0.3 mL 0   fenofibrate 160 MG tablet TAKE 1 TABLET(160 MG) BY MOUTH DAILY (Patient not taking: Reported on 08/07/2021) 30 tablet 0   lisinopril (ZESTRIL) 10 MG tablet TAKE 1 TABLET(10 MG) BY MOUTH DAILY (Patient not taking: Reported on 08/07/2021) 90 tablet 1   rosuvastatin (CRESTOR) 10 MG tablet TAKE  1 TABLET BY MOUTH DAILY (Patient not taking: Reported on 08/07/2021) 90 tablet 1   No facility-administered medications prior to visit.    Allergies  Allergen Reactions   Codeine     Adverse reaction, per pt   Simvastatin     Review of Systems  Constitutional:  Negative for fever and malaise/fatigue.  HENT:  Negative for congestion.   Eyes:  Negative for blurred vision.  Respiratory:  Negative for shortness of breath.   Cardiovascular:  Negative for chest pain, palpitations and leg swelling.  Gastrointestinal:  Negative for abdominal pain, blood in stool and nausea.  Genitourinary:  Negative for dysuria and frequency.  Musculoskeletal:  Negative for falls.  Skin:  Negative for rash.  Neurological:  Negative for dizziness, loss of consciousness and headaches.  Endo/Heme/Allergies:  Negative for environmental allergies.  Psychiatric/Behavioral:  Positive for depression and suicidal ideas. Negative for hallucinations, memory loss and substance abuse. The patient is not nervous/anxious and does not have insomnia.        (+)thoughts of harming  but states he is too chicken to follow through with it  He promises in front of his wife not to hurt himself or anyone else       Objective:    Physical Exam Vitals and nursing note reviewed.  Constitutional:      General: He is not in acute distress.    Appearance: Normal appearance. He is not ill-appearing.  HENT:     Head: Normocephalic and atraumatic.     Right Ear: External ear normal.     Left Ear: External ear normal.  Eyes:     Extraocular Movements: Extraocular movements intact.     Pupils: Pupils are equal, round, and reactive to light.  Cardiovascular:     Rate and Rhythm: Normal rate and regular rhythm.     Heart sounds: Normal heart sounds. No murmur heard.   No gallop.  Pulmonary:     Effort: Pulmonary effort is normal. No respiratory distress.     Breath sounds: Normal breath sounds. No wheezing or rales.  Skin:     General: Skin is warm and dry.  Neurological:     Mental Status: He is alert and oriented to person, place, and time.  Psychiatric:        Mood and Affect: Mood is depressed. Affect is blunt and flat.        Speech: Speech normal.        Behavior: Behavior is slowed and withdrawn. Behavior is cooperative.        Thought Content: Thought content includes suicidal (scuicidal thoughts with no plan) ideation. Thought content does  not include homicidal ideation. Thought content does not include homicidal or suicidal plan.     Comments: Verbally said he would not hurt himself    BP (!) 142/97 (BP Location: Left Arm, Cuff Size: Large)    Pulse 76    Temp 97.6 F (36.4 C) (Oral)    Resp 12    Ht 5\' 10"  (1.778 m)    Wt 251 lb (113.9 kg)    SpO2 99%    BMI 36.01 kg/m  Wt Readings from Last 3 Encounters:  08/07/21 251 lb (113.9 kg)  12/04/20 250 lb 6.4 oz (113.6 kg)  11/16/20 250 lb 6.4 oz (113.6 kg)    Diabetic Foot Exam - Simple   No data filed    Lab Results  Component Value Date   WBC 4.8 11/16/2020   HGB 14.4 11/16/2020   HCT 41.9 11/16/2020   PLT 252.0 11/16/2020   GLUCOSE 102 (H) 01/17/2021   CHOL 160 01/17/2021   TRIG 132.0 01/17/2021   HDL 46.60 01/17/2021   LDLDIRECT 108.0 04/01/2017   LDLCALC 87 01/17/2021   ALT 49 01/17/2021   AST 30 01/17/2021   NA 140 01/17/2021   K 5.3 No hemolysis seen (H) 01/17/2021   CL 104 01/17/2021   CREATININE 1.25 01/17/2021   BUN 20 01/17/2021   CO2 30 01/17/2021   TSH 3.46 11/16/2020   PSA 0.37 11/16/2020   MICROALBUR <0.7 11/16/2020    Lab Results  Component Value Date   TSH 3.46 11/16/2020   Lab Results  Component Value Date   WBC 4.8 11/16/2020   HGB 14.4 11/16/2020   HCT 41.9 11/16/2020   MCV 89.3 11/16/2020   PLT 252.0 11/16/2020   Lab Results  Component Value Date   NA 140 01/17/2021   K 5.3 No hemolysis seen (H) 01/17/2021   CO2 30 01/17/2021   GLUCOSE 102 (H) 01/17/2021   BUN 20 01/17/2021   CREATININE 1.25  01/17/2021   BILITOT 0.5 01/17/2021   ALKPHOS 46 01/17/2021   AST 30 01/17/2021   ALT 49 01/17/2021   PROT 7.1 01/17/2021   ALBUMIN 4.6 01/17/2021   CALCIUM 9.7 01/17/2021   GFR 69.60 01/17/2021   Lab Results  Component Value Date   CHOL 160 01/17/2021   Lab Results  Component Value Date   HDL 46.60 01/17/2021   Lab Results  Component Value Date   LDLCALC 87 01/17/2021   Lab Results  Component Value Date   TRIG 132.0 01/17/2021   Lab Results  Component Value Date   CHOLHDL 3 01/17/2021   No results found for: HGBA1C     Assessment & Plan:   Problem List Items Addressed This Visit       Unprioritized   ADD (attention deficit disorder)    Refill adderall  uds / contract to be updated       Relevant Medications   amphetamine-dextroamphetamine (ADDERALL) 20 MG tablet   Depression with anxiety - Primary    Long discussion about suicidal ideation---  He states he will not harm himself  Pt with wife and pt about where to go if he feels as if he wants to hurt himself  Pt instructed to go to [facility] or [facility]  Suspect maybe bipolar or pt wife concerned about Autism       Relevant Medications   escitalopram (LEXAPRO) 20 MG tablet   ALPRAZolam (XANAX) 0.25 MG tablet   ARIPiprazole (ABILIFY) 2 MG tablet  Other Relevant Orders   TSH   Vitamin B12   VITAMIN D 25 Hydroxy (Vit-D Deficiency, Fractures)   Ambulatory referral to Psychology   Hyperlipidemia LDL goal <100    Encourage heart healthy diet such as MIND or DASH diet, increase exercise, avoid trans fats, simple carbohydrates and processed foods, consider a krill or fish or flaxseed oil cap daily.  Pt was off meds Check labs and restart meds       Relevant Medications   lisinopril (ZESTRIL) 10 MG tablet   rosuvastatin (CRESTOR) 10 MG tablet   Other Relevant Orders   Lipid panel   Comprehensive metabolic panel   CBC with Differential/Platelet   Morbid obesity (HCC)   Relevant  Medications   amphetamine-dextroamphetamine (ADDERALL) 20 MG tablet   Other Relevant Orders   Insulin, random   Primary hypertension    Poorly controlled will alter medications, encouraged DASH diet, minimize caffeine and obtain adequate sleep. Report concerning symptoms and follow up as directed and as needed Restart lisinopril hct  F/u 1 month or sooner prn       Relevant Medications   lisinopril (ZESTRIL) 10 MG tablet   rosuvastatin (CRESTOR) 10 MG tablet   Other Visit Diagnoses     Anxiety state       Relevant Medications   escitalopram (LEXAPRO) 20 MG tablet   ALPRAZolam (XANAX) 0.25 MG tablet        Meds ordered this encounter  Medications   lisinopril (ZESTRIL) 10 MG tablet    Sig: TAKE 1 TABLET(10 MG) BY MOUTH DAILY    Dispense:  90 tablet    Refill:  1   DISCONTD: fenofibrate 160 MG tablet    Sig: TAKE 1 TABLET(160 MG) BY MOUTH DAILY    Dispense:  30 tablet    Refill:  0   rosuvastatin (CRESTOR) 10 MG tablet    Sig: TAKE 1 TABLET BY MOUTH DAILY    Dispense:  90 tablet    Refill:  1   amphetamine-dextroamphetamine (ADDERALL) 20 MG tablet    Sig: Take 1 tablet (20 mg total) by mouth 3 (three) times daily for 7 days.    Dispense:  21 tablet    Refill:  0   escitalopram (LEXAPRO) 20 MG tablet    Sig: Take 1 tablet (20 mg total) by mouth daily.    Dispense:  90 tablet    Refill:  1   DISCONTD: ARIPiprazole (ABILIFY) 2 MG tablet    Sig: Take 1 tablet (2 mg total) by mouth daily.    Dispense:  30 tablet    Refill:  1   ALPRAZolam (XANAX) 0.25 MG tablet    Sig: Take 1 tablet (0.25 mg total) by mouth 2 (two) times daily as needed for anxiety.    Dispense:  20 tablet    Refill:  0   ARIPiprazole (ABILIFY) 2 MG tablet    Sig: Take 1 tablet (2 mg total) by mouth daily.    Dispense:  30 tablet    Refill:  1    I, Ann Held, DO, personally preformed the services described in this documentation.  All medical record entries made by the scribe were at  my direction and in my presence.  I have reviewed the chart and discharge instructions (if applicable) and agree that the record reflects my personal performance and is accurate and complete. 08/07/2021   I,[name],acting as a scribe for Ann Held, DO.,have documented all relevant documentation  on the behalf of Ann Held, DO,as directed by  Ann Held, DO while in the presence of Ann Held, DO.   Ann Held, DO

## 2021-08-07 NOTE — Patient Instructions (Signed)

## 2021-08-08 ENCOUNTER — Other Ambulatory Visit (INDEPENDENT_AMBULATORY_CARE_PROVIDER_SITE_OTHER): Payer: 59

## 2021-08-08 ENCOUNTER — Encounter: Payer: Self-pay | Admitting: *Deleted

## 2021-08-08 DIAGNOSIS — Z79899 Other long term (current) drug therapy: Secondary | ICD-10-CM

## 2021-08-08 DIAGNOSIS — E785 Hyperlipidemia, unspecified: Secondary | ICD-10-CM | POA: Diagnosis not present

## 2021-08-08 DIAGNOSIS — F418 Other specified anxiety disorders: Secondary | ICD-10-CM | POA: Insufficient documentation

## 2021-08-08 DIAGNOSIS — F988 Other specified behavioral and emotional disorders with onset usually occurring in childhood and adolescence: Secondary | ICD-10-CM

## 2021-08-08 LAB — CBC WITH DIFFERENTIAL/PLATELET
Basophils Absolute: 0 10*3/uL (ref 0.0–0.1)
Basophils Relative: 1 % (ref 0.0–3.0)
Eosinophils Absolute: 0.1 10*3/uL (ref 0.0–0.7)
Eosinophils Relative: 1.8 % (ref 0.0–5.0)
HCT: 43.4 % (ref 39.0–52.0)
Hemoglobin: 14.7 g/dL (ref 13.0–17.0)
Lymphocytes Relative: 40.1 % (ref 12.0–46.0)
Lymphs Abs: 1.6 10*3/uL (ref 0.7–4.0)
MCHC: 33.8 g/dL (ref 30.0–36.0)
MCV: 90.7 fl (ref 78.0–100.0)
Monocytes Absolute: 0.3 10*3/uL (ref 0.1–1.0)
Monocytes Relative: 8.2 % (ref 3.0–12.0)
Neutro Abs: 1.9 10*3/uL (ref 1.4–7.7)
Neutrophils Relative %: 48.9 % (ref 43.0–77.0)
Platelets: 259 10*3/uL (ref 150.0–400.0)
RBC: 4.79 Mil/uL (ref 4.22–5.81)
RDW: 13.4 % (ref 11.5–15.5)
WBC: 3.9 10*3/uL — ABNORMAL LOW (ref 4.0–10.5)

## 2021-08-08 LAB — COMPREHENSIVE METABOLIC PANEL
ALT: 44 U/L (ref 0–53)
AST: 28 U/L (ref 0–37)
Albumin: 4.7 g/dL (ref 3.5–5.2)
Alkaline Phosphatase: 57 U/L (ref 39–117)
BUN: 12 mg/dL (ref 6–23)
CO2: 31 mEq/L (ref 19–32)
Calcium: 9.8 mg/dL (ref 8.4–10.5)
Chloride: 103 mEq/L (ref 96–112)
Creatinine, Ser: 0.97 mg/dL (ref 0.40–1.50)
GFR: 93.98 mL/min (ref >60.00)
Glucose, Bld: 114 mg/dL — ABNORMAL HIGH (ref 70–99)
Potassium: 5.2 mEq/L — ABNORMAL HIGH (ref 3.5–5.1)
Sodium: 140 mEq/L (ref 135–145)
Total Bilirubin: 0.6 mg/dL (ref 0.2–1.2)
Total Protein: 7.7 g/dL (ref 6.0–8.3)

## 2021-08-08 LAB — VITAMIN D 25 HYDROXY (VIT D DEFICIENCY, FRACTURES): VITD: 10.22 ng/mL — ABNORMAL LOW (ref 30.00–100.00)

## 2021-08-08 LAB — LIPID PANEL
Cholesterol: 268 mg/dL — ABNORMAL HIGH (ref 0–200)
HDL: 45.8 mg/dL (ref >39.00)
LDL Cholesterol: 193 mg/dL — ABNORMAL HIGH (ref 0–99)
NonHDL: 222.37
Total CHOL/HDL Ratio: 6
Triglycerides: 145 mg/dL (ref 0.0–149.0)
VLDL: 29 mg/dL (ref 0.0–40.0)

## 2021-08-08 LAB — TSH: TSH: 1.99 u[IU]/mL (ref 0.35–5.50)

## 2021-08-08 LAB — VITAMIN B12: Vitamin B-12: 298 pg/mL (ref 211–911)

## 2021-08-08 NOTE — Assessment & Plan Note (Signed)
Poorly controlled will alter medications, encouraged DASH diet, minimize caffeine and obtain adequate sleep. Report concerning symptoms and follow up as directed and as needed Restart lisinopril hct  F/u 1 month or sooner prn

## 2021-08-08 NOTE — Assessment & Plan Note (Signed)
Encourage heart healthy diet such as MIND or DASH diet, increase exercise, avoid trans fats, simple carbohydrates and processed foods, consider a krill or fish or flaxseed oil cap daily.  Pt was off meds Check labs and restart meds

## 2021-08-08 NOTE — Assessment & Plan Note (Addendum)
Long discussion about suicidal ideation---  He states he will not harm himself  Pt with wife and pt about where to go if he feels as if he wants to hurt himself  Pt instructed to go to [facility] or [facility] in  Suspect maybe bipolar or pt wife concerned about Autism

## 2021-08-08 NOTE — Assessment & Plan Note (Signed)
Refill adderall  uds / contract to be updated

## 2021-08-08 NOTE — Addendum Note (Signed)
Addended by: Kem Boroughs D on: 08/08/2021 10:33 AM   Modules accepted: Orders

## 2021-08-09 LAB — INSULIN, RANDOM: Insulin: 18.8 u[IU]/mL — ABNORMAL HIGH

## 2021-08-10 LAB — DM TEMPLATE

## 2021-08-11 LAB — DRUG MONITORING PANEL 376104, URINE
Amphetamine: 5281 ng/mL — ABNORMAL HIGH (ref <250)
Amphetamines: POSITIVE ng/mL — AB (ref <500)
Barbiturates: NEGATIVE ng/mL (ref <300)
Benzodiazepines: NEGATIVE ng/mL (ref <100)
Cocaine Metabolite: NEGATIVE ng/mL (ref <150)
Desmethyltramadol: NEGATIVE ng/mL (ref <100)
Methamphetamine: NEGATIVE ng/mL (ref <250)
Opiates: NEGATIVE ng/mL (ref <100)
Oxycodone: NEGATIVE ng/mL (ref <100)
Tramadol: NEGATIVE ng/mL (ref <100)

## 2021-08-11 LAB — DM TEMPLATE

## 2021-08-13 ENCOUNTER — Other Ambulatory Visit (INDEPENDENT_AMBULATORY_CARE_PROVIDER_SITE_OTHER): Payer: 59

## 2021-08-13 ENCOUNTER — Other Ambulatory Visit: Payer: Self-pay | Admitting: *Deleted

## 2021-08-13 DIAGNOSIS — E8881 Metabolic syndrome: Secondary | ICD-10-CM

## 2021-08-13 LAB — HEMOGLOBIN A1C: Hgb A1c MFr Bld: 5.7 % (ref 4.6–6.5)

## 2021-08-15 ENCOUNTER — Other Ambulatory Visit: Payer: Self-pay | Admitting: Family Medicine

## 2021-08-15 ENCOUNTER — Encounter: Payer: Self-pay | Admitting: Family Medicine

## 2021-08-15 DIAGNOSIS — F988 Other specified behavioral and emotional disorders with onset usually occurring in childhood and adolescence: Secondary | ICD-10-CM

## 2021-08-15 MED ORDER — AMPHETAMINE-DEXTROAMPHETAMINE 20 MG PO TABS: 20.0000 mg | ORAL_TABLET | Freq: Three times a day (TID) | ORAL | 0 refills | Status: DC

## 2021-08-20 ENCOUNTER — Other Ambulatory Visit: Payer: Self-pay | Admitting: Family Medicine

## 2021-08-20 MED ORDER — AMPHETAMINE-DEXTROAMPHET ER 20 MG PO CP24: 20.0000 mg | ORAL_CAPSULE | Freq: Every day | ORAL | 0 refills | Status: DC

## 2021-09-03 ENCOUNTER — Other Ambulatory Visit: Payer: Self-pay | Admitting: Family Medicine

## 2021-09-03 DIAGNOSIS — E785 Hyperlipidemia, unspecified: Secondary | ICD-10-CM

## 2021-09-04 ENCOUNTER — Ambulatory Visit: Payer: Self-pay | Admitting: Family Medicine

## 2021-09-04 ENCOUNTER — Encounter: Payer: Self-pay | Admitting: Family Medicine

## 2021-09-04 ENCOUNTER — Ambulatory Visit: Payer: 59 | Admitting: Family Medicine

## 2021-09-04 VITALS — BP 145/82 | HR 85 | Temp 97.4°F | Resp 16 | Ht 70.0 in | Wt 253.2 lb

## 2021-09-04 DIAGNOSIS — E785 Hyperlipidemia, unspecified: Secondary | ICD-10-CM

## 2021-09-04 DIAGNOSIS — K921 Melena: Secondary | ICD-10-CM

## 2021-09-04 DIAGNOSIS — F988 Other specified behavioral and emotional disorders with onset usually occurring in childhood and adolescence: Secondary | ICD-10-CM

## 2021-09-04 DIAGNOSIS — Z3009 Encounter for other general counseling and advice on contraception: Secondary | ICD-10-CM | POA: Diagnosis not present

## 2021-09-04 DIAGNOSIS — F418 Other specified anxiety disorders: Secondary | ICD-10-CM | POA: Diagnosis not present

## 2021-09-04 DIAGNOSIS — I10 Essential (primary) hypertension: Secondary | ICD-10-CM

## 2021-09-04 MED ORDER — AMPHETAMINE-DEXTROAMPHETAMINE 20 MG PO TABS: ORAL_TABLET | ORAL | 0 refills | Status: DC

## 2021-09-04 MED ORDER — NONFORMULARY OR COMPOUNDED ITEM: 0 refills | Status: DC

## 2021-09-04 MED ORDER — LISINOPRIL 20 MG PO TABS: 20.0000 mg | ORAL_TABLET | Freq: Every day | ORAL | 3 refills | Status: DC

## 2021-09-04 MED ORDER — ARIPIPRAZOLE 5 MG PO TABS: 5.0000 mg | ORAL_TABLET | Freq: Every day | ORAL | 1 refills | Status: DC

## 2021-09-04 NOTE — Progress Notes (Addendum)
? ?Subjective:  ? ?By signing my name below, I, [name], attest that this documentation has been prepared under the direction and in the presence of Dr. Roma Schanz, DO. 09/04/2021 ? ? ? Patient ID: Kevin Mueller, male    DOB: Aug 03, 1975, 46 y.o.   MRN: [id number] ? ?Chief Complaint  ?Patient presents with  ? Follow-up  ?  Here for follow up   ? ? ?HPI ?Patient is in today for a office visit.  ?He complains of occasional dark blood in his stools. He thinks it may be his hemorrhoid. He is interested in setting up an appointment for a colonoscopy. He reports having a colonoscopy prior due to have similar symptoms but only found IBS.  ? ?He reports 2 mg Abilify has improved his symptoms but he found no change while taking adderall-XR. He found more improvement while taking adderall-IR. His wife reports he has a flat affect while taking adderall-XR.  ?He has an upcomming appointment with a behavior health specialist at the end of this month. ?He is interested in getting a vasectomy.  ?His blood pressure is elevated during this visit. He continues taking 10 mg Zestril daily PO and reports no new issues while taking it.  ?BP Readings from Last 3 Encounters:  ?09/04/21 (!) 145/82  ?08/07/21 (!) 142/97  ?12/04/20 120/76  ? ?Pulse Readings from Last 3 Encounters:  ?09/04/21 85  ?08/07/21 76  ?12/04/20 72  ? ?He reports his paternal grandfather has a history of diabetes and his father has a history of pre-diabetes.   ?Lab Results  ?Component Value Date  ? HGBA1C 5.7 08/13/2021  ? ?He reports taking a OTC herbal testosterone supplement due to not retaining muscles, bad sleep quality, low energy, and having low libido.  ?He continues chewing nicorette gum but reports having less desire to chew them recently.  ? ? ?Past Medical History:  ?Diagnosis Date  ? Elevated blood pressure (not hypertension)   ? Hamartoma ([place])   ? Hamartoma of lung ([place]) 10/20/2012  ? Hyperlipidemia   ? IBS (irritable bowel syndrome)   ?  Insomnia 09/26/2012  ? Primary hypertension 11/16/2020  ? ? ?Past Surgical History:  ?Procedure Laterality Date  ? LUNG BIOPSY    ? FNA under xray guidance 1998 @ [facility]  ? ? ?Family History  ?Problem Relation Age of Onset  ? Diabetes Other   ? Hyperlipidemia Other   ? Cancer Other   ?     PGM ? digestive  ? Kidney disease Other   ? Squamous cell carcinoma Father   ? High blood pressure Father   ? Hyperlipidemia Father   ? ? ?Social History  ? ?Socioeconomic History  ? Marital status: Married  ?  Spouse name: Not on file  ? Number of children: Not on file  ? Years of education: Not on file  ? Highest education level: Not on file  ?Occupational History  ? Occupation: Soil scientist for [employer]   ?Tobacco Use  ? Smoking status: Former  ?  Packs/day: 2.00  ?  Types: Cigarettes  ?  Start date: 10/01/2006  ? Smokeless tobacco: Never  ?Substance and Sexual Activity  ? Alcohol use: Yes  ?  Alcohol/week: 28.0 standard drinks  ?  Types: 28 Cans of beer per week  ? Drug use: No  ? Sexual activity: Yes  ?  Partners: Female  ?Other Topics Concern  ? Not on file  ?Social History Narrative  ? Exercise---  Some weights,  Running after kids  ? ?Social Determinants of Health  ? ?Financial Resource Strain: Not on file  ?Food Insecurity: Not on file  ?Transportation Needs: Not on file  ?Physical Activity: Not on file  ?Stress: Not on file  ?Social Connections: Not on file  ?Intimate Partner Violence: Not on file  ? ? ?Outpatient Medications Prior to Visit  ?Medication Sig Dispense Refill  ? ALPRAZolam (XANAX) 0.25 MG tablet Take 1 tablet (0.25 mg total) by mouth 2 (two) times daily as needed for anxiety. 20 tablet 0  ? escitalopram (LEXAPRO) 20 MG tablet Take 1 tablet (20 mg total) by mouth daily. 90 tablet 1  ? fenofibrate 160 MG tablet TAKE 1 TABLET(160 MG) BY MOUTH DAILY 90 tablet 1  ? KRILL OIL PO Take by mouth.    ? rosuvastatin (CRESTOR) 10 MG tablet TAKE 1 TABLET BY MOUTH DAILY 90 tablet 1  ? amphetamine-dextroamphetamine  (ADDERALL XR) 20 MG 24 hr capsule Take 1 capsule (20 mg total) by mouth daily. 30 capsule 0  ? ARIPiprazole (ABILIFY) 2 MG tablet Take 1 tablet (2 mg total) by mouth daily. 30 tablet 1  ? lisinopril (ZESTRIL) 10 MG tablet TAKE 1 TABLET(10 MG) BY MOUTH DAILY 90 tablet 1  ? ?No facility-administered medications prior to visit.  ? ? ?Allergies  ?Allergen Reactions  ? Codeine   ?  Adverse reaction, per pt  ? Simvastatin   ? ? ?Review of Systems  ?Constitutional:  Negative for fever and malaise/fatigue.  ?HENT:  Negative for congestion.   ?Eyes:  Negative for blurred vision.  ?Respiratory:  Negative for shortness of breath.   ?Cardiovascular:  Negative for chest pain, palpitations and leg swelling.  ?Gastrointestinal:  Negative for abdominal pain, blood in stool and nausea.  ?Genitourinary:  Negative for dysuria and frequency.  ?Musculoskeletal:  Negative for falls.  ?Skin:  Negative for rash.  ?Neurological:  Negative for dizziness, loss of consciousness and headaches.  ?Endo/Heme/Allergies:  Negative for environmental allergies.  ?Psychiatric/Behavioral:  Negative for depression. The patient is not nervous/anxious.   ? ?   ?Objective:  ?  ?Physical Exam ?Vitals and nursing note reviewed.  ?Constitutional:   ?   General: He is not in acute distress. ?   Appearance: Normal appearance. He is not ill-appearing.  ?HENT:  ?   Head: Normocephalic and atraumatic.  ?   Right Ear: External ear normal.  ?   Left Ear: External ear normal.  ?Eyes:  ?   Extraocular Movements: Extraocular movements intact.  ?   Pupils: Pupils are equal, round, and reactive to light.  ?Cardiovascular:  ?   Rate and Rhythm: Normal rate and regular rhythm.  ?   Heart sounds: Normal heart sounds. No murmur heard. ?  No gallop.  ?Pulmonary:  ?   Effort: Pulmonary effort is normal. No respiratory distress.  ?   Breath sounds: Normal breath sounds. No wheezing or rales.  ?Skin: ?   General: Skin is warm and dry.  ?Neurological:  ?   Mental Status: He is  alert and oriented to person, place, and time.  ?Psychiatric:     ?   Judgment: Judgment normal.  ? ? ?BP (!) 145/82 (BP Location: Right Arm, Patient Position: Sitting, Cuff Size: Normal)   Pulse 85   Temp (!) 97.4 ?F (36.3 ?C) (Oral)   Resp 16   Ht '5\' 10"'$  (1.778 m)   Wt 253 lb 3.2 oz (114.9 kg)   SpO2 100%   BMI 36.33  kg/m?  ?Wt Readings from Last 3 Encounters:  ?09/04/21 253 lb 3.2 oz (114.9 kg)  ?08/07/21 251 lb (113.9 kg)  ?12/04/20 250 lb 6.4 oz (113.6 kg)  ? ? ?Diabetic Foot Exam - Simple   ?No data filed ?  ? ?Lab Results  ?Component Value Date  ? WBC 3.9 (L) 08/08/2021  ? HGB 14.7 08/08/2021  ? HCT 43.4 08/08/2021  ? PLT 259.0 08/08/2021  ? GLUCOSE 114 (H) 08/08/2021  ? CHOL 268 (H) 08/08/2021  ? TRIG 145.0 08/08/2021  ? HDL 45.80 08/08/2021  ? LDLDIRECT 108.0 04/01/2017  ? LDLCALC 193 (H) 08/08/2021  ? ALT 44 08/08/2021  ? AST 28 08/08/2021  ? NA 140 08/08/2021  ? K 5.2 (H) 08/08/2021  ? CL 103 08/08/2021  ? CREATININE 0.97 08/08/2021  ? BUN 12 08/08/2021  ? CO2 31 08/08/2021  ? TSH 1.99 08/08/2021  ? PSA 0.37 11/16/2020  ? HGBA1C 5.7 08/13/2021  ? MICROALBUR <0.7 11/16/2020  ? ? ?Lab Results  ?Component Value Date  ? TSH 1.99 08/08/2021  ? ?Lab Results  ?Component Value Date  ? WBC 3.9 (L) 08/08/2021  ? HGB 14.7 08/08/2021  ? HCT 43.4 08/08/2021  ? MCV 90.7 08/08/2021  ? PLT 259.0 08/08/2021  ? ?Lab Results  ?Component Value Date  ? NA 140 08/08/2021  ? K 5.2 (H) 08/08/2021  ? CO2 31 08/08/2021  ? GLUCOSE 114 (H) 08/08/2021  ? BUN 12 08/08/2021  ? CREATININE 0.97 08/08/2021  ? BILITOT 0.6 08/08/2021  ? ALKPHOS 57 08/08/2021  ? AST 28 08/08/2021  ? ALT 44 08/08/2021  ? PROT 7.7 08/08/2021  ? ALBUMIN 4.7 08/08/2021  ? CALCIUM 9.8 08/08/2021  ? GFR 93.98 08/08/2021  ? ?Lab Results  ?Component Value Date  ? CHOL 268 (H) 08/08/2021  ? ?Lab Results  ?Component Value Date  ? HDL 45.80 08/08/2021  ? ?Lab Results  ?Component Value Date  ? LDLCALC 193 (H) 08/08/2021  ? ?Lab Results  ?Component Value Date  ?  TRIG 145.0 08/08/2021  ? ?Lab Results  ?Component Value Date  ? CHOLHDL 6 08/08/2021  ? ?Lab Results  ?Component Value Date  ? HGBA1C 5.7 08/13/2021  ? ? ?   ?Assessment & Plan:  ? ?Problem List Items Addressed This Frederico Hamman

## 2021-09-04 NOTE — Assessment & Plan Note (Signed)
Well controlled, no changes to meds. Encouraged heart healthy diet such as the DASH diet and exercise as tolerated.  °

## 2021-09-04 NOTE — Assessment & Plan Note (Signed)
Switch to IR adderall tid  ?Pt has app with psychology for testing  ?

## 2021-09-04 NOTE — Patient Instructions (Signed)
Major Depressive Disorder, Adult °Major depressive disorder (MDD) is a mental health condition. It may also be called clinical depression or unipolar depression. MDD causes symptoms of sadness, hopelessness, and loss of interest in things. These symptoms last most of the day, almost every day, for 2 weeks. MDD can also cause physical symptoms. It can interfere with relationships and with everyday activities, such as work, school, and activities that are usually pleasant. °MDD may be mild, moderate, or severe. It may be single-episode MDD, which happens once, or recurrent MDD, which may occur multiple times. °What are the causes? °The exact cause of this condition is not known. MDD is most likely caused by a combination of things, which may include: °Your personality traits. °Learned or conditioned behaviors or thoughts or feelings that reinforce negativity. °Any alcohol or substance misuse. °Long-term (chronic) physical or mental health illness. °Going through a traumatic experience or major life changes. °What increases the risk? °The following factors may make someone more likely to develop MDD: °A family history of depression. °Being a woman. °Troubled family relationships. °Abnormally low levels of certain brain chemicals. °Traumatic or painful events in childhood, especially abuse or loss of a parent. °A lot of stress from life experiences, such as poor living conditions or discrimination. °Chronic physical illness or other mental health disorders. °What are the signs or symptoms? °The main symptoms of MDD usually include: °Constant depressed or irritable mood. °A loss of interest in things and activities. °Other symptoms include: °Sleeping or eating too much or too little. °Unexplained weight gain or weight loss. °Tiredness or low energy. °Being agitated, restless, or weak. °Feeling hopeless, worthless, or guilty. °Trouble thinking clearly or making decisions. °Thoughts of suicide or thoughts of harming  others. °Isolating oneself or avoiding other people or activities. °Trouble completing tasks, work, or any normal obligations. °Severe symptoms of this condition may include: °Psychotic depression.This may include false beliefs, or delusions. It may also include seeing, hearing, tasting, smelling, or feeling things that are not real (hallucinations). °Chronic depression or persistent depressive disorder. This is low-level depression that lasts for at least 2 years. °Melancholic depression, or feeling extremely sad and hopeless. °Catatonic depression, which includes trouble speaking and trouble moving. °How is this diagnosed? °This condition may be diagnosed based on: °Your symptoms. °Your medical and mental health history. You may be asked questions about your lifestyle, including any drug and alcohol use. °A physical exam. °Blood tests to rule out other conditions. °MDD is confirmed if you have the following symptoms most of the day, nearly every day, in a 2-week period: °Either a depressed mood or loss of interest. °At least four other MDD symptoms. °How is this treated? °This condition is usually treated by mental health professionals, such as psychologists, psychiatrists, and clinical social workers. You may need more than one type of treatment. Treatment may include: °Psychotherapy, also called talk therapy or counseling. Types of psychotherapy include: °Cognitive behavioral therapy (CBT). This teaches you to recognize unhealthy feelings, thoughts, and behaviors, and replace them with positive thoughts and actions. °Interpersonal therapy (IPT). This helps you to improve the way you communicate with others or relate to them. °Family therapy. This treatment includes members of your family. °Medicines to treat anxiety and depression. These medicines help to balance the brain chemicals that affect your emotions. °Lifestyle changes. You may be asked to: °Limit alcohol use and avoid drug use. °Get regular  exercise. °Get plenty of sleep. °Make healthy eating choices. °Spend more time outdoors. °Brain stimulation. This may   be done if symptoms are very severe and other treatments have not worked. Examples of this treatment are electroconvulsive therapy and transcranial magnetic stimulation. °Follow these instructions at home: °Activity °Exercise regularly and spend time outdoors. °Find activities that you enjoy doing, and make time to do them. °Find healthy ways to manage stress, such as: °Meditation or deep breathing. °Spending time in nature. °Journaling. °Return to your normal activities as told by your health care provider. Ask your health care provider what activities are safe for you. °Alcohol and drug use °If you drink alcohol: °Limit how much you use to: °0-1 drink a day for women who are not pregnant. °0-2 drinks a day for men. °Be aware of how much alcohol is in your drink. In the [place] one drink equals one 12 oz bottle of beer (355 mL), one 5 oz glass of wine (148 mL), or one 1½ oz glass of hard liquor (44 mL). °Discuss your alcohol use with your health care provider. Alcohol can affect any antidepressant medicines you are taking. °Discuss any drug use with your health care provider. °General instructions ° °Take over-the-counter and prescription medicines only as told by your health care provider. °Eat a healthy diet and get plenty of sleep. °Consider joining a support group. Your health care provider may be able to recommend one. °Keep all follow-up visits as told by your health care provider. This is important. °Where to find more information °National Alliance on Mental Illness: www.nami.org °[place] National Institute of Mental Health: www.nimh.nih.gov °Contact a health care provider if: °Your symptoms get worse. °You develop new symptoms. °Get help right away if: °You self-harm. °You have serious thoughts about hurting yourself or others. °You hallucinate. °If you ever feel like you may hurt yourself or  others, or have thoughts about taking your own life, get help right away. Go to your nearest emergency department or: °Call your local emergency services (911 in the [place] °Call a suicide crisis helpline, such as the National Suicide Prevention Lifeline at 1-800-273-8255 or 988 in the [place] This is open 24 hours a day in the [place] °Text the Crisis Text Line at 741741 (in the [place] °Summary °Major depressive disorder (MDD) is a mental health condition. MDD causes symptoms of sadness, hopelessness, and loss of interest in things. These symptoms last most of the day, almost every day, for 2 weeks. °The symptoms of MDD can interfere with relationships and with everyday activities. °Treatments and support are available for people who develop MDD. You may need more than one type of treatment. °Get help right away if you have serious thoughts about hurting yourself or others. °This information is not intended to replace advice given to you by your health care provider. Make sure you discuss any questions you have with your health care provider. °Document Revised: 01/03/2021 Document Reviewed: 05/22/2019 °Elsevier Patient Education © 2022 Elsevier Inc. ° °

## 2021-09-04 NOTE — Assessment & Plan Note (Signed)
con't lexapro and inc abilify to 5 mg daily  ? ?

## 2021-09-04 NOTE — Assessment & Plan Note (Signed)
Encourage heart healthy diet such as MIND or DASH diet, increase exercise, avoid trans fats, simple carbohydrates and processed foods, consider a krill or fish or flaxseed oil cap daily.  °

## 2021-09-07 LAB — TESTOS,TOTAL,FREE AND SHBG (FEMALE)
Free Testosterone: 39.5 pg/mL (ref 35.0–155.0)
Sex Hormone Binding: 8 nmol/L — ABNORMAL LOW (ref 10–50)
Testosterone, Total, LC-MS-MS: 178 ng/dL — ABNORMAL LOW (ref 250–1100)

## 2021-09-19 ENCOUNTER — Encounter: Payer: Self-pay | Admitting: Psychology

## 2021-09-19 ENCOUNTER — Ambulatory Visit (INDEPENDENT_AMBULATORY_CARE_PROVIDER_SITE_OTHER): Payer: Self-pay | Admitting: Psychology

## 2021-09-19 DIAGNOSIS — F902 Attention-deficit hyperactivity disorder, combined type: Secondary | ICD-10-CM

## 2021-09-19 DIAGNOSIS — F4010 Social phobia, unspecified: Secondary | ICD-10-CM

## 2021-09-19 DIAGNOSIS — F3341 Major depressive disorder, recurrent, in partial remission: Secondary | ICD-10-CM

## 2021-09-19 NOTE — Progress Notes (Signed)
[facility] Counselor Initial Adult Exam ? ?Name: Kevin Mueller ?Date: 09/19/2021 ?MRN: [id number] ?DOB: 05-Jun-1976 ?PCP: Kevin Held, DO ? ?Time spent: 9 - 10 am ? ?Informant: Patient and spouse Kevin Mueller   ? ?Paperwork requested: No  ? ?Met with patient and spouse for initial interview.  Patient and spouse were at home and session was conducted from therapist's office via video conferencing.  Patient and spouse verbally consented to telehealth. ?     ?Reason for Visit /Presenting Problem: patient reported life long recurrent bouts of depression.  Participated in college counseling/mental health services who diagnosed him with ADHD in 2006 and put him in medication.  Also has much anxiety,especially social anxiety. Family history is significant for ADHD including daughter and spouse.  While doing research on neuro-diversity, patient and spouse began to suspect he has Autism Spectrum Disorder.  PCP recommended testing related to this.  Has compulsive behaviors (family history of OCD).  Patient was able to do well in school by memorizing what he was supposed to learn rather than understanding it.  Periods of depression include low self esteem.  Patient easily triggered to anger with poor emotion regulation, then feels excessive guilt/shame after over-reacting, including believing that he didn't deserve to live.  Nephew has ASD.                 ? ?Mental Status Exam: ?Appearance:   Neat and Well Groomed     ?Behavior:  Appropriate and Sharing  ?Motor:  Restlestness  ?Speech/Language:   Clear and Coherent and Normal Rate Excessive   ?Affect:  Appropriate and Full Range  ?Mood:  euthymic  ?Thought process:  flight of ideas  ?Thought content:    WNL  ?Sensory/Perceptual disturbances:    WNL  ?Orientation:  oriented to person, place, time/date, and situation  ?Attention:  Good  ?Concentration:  Good  ?Memory:  WNL  ?Fund of knowledge:   Good  ?Insight:    Good  ?Judgment:   Good  ?Impulse  Control:  Good  ? ?Reported Symptoms:  Sleep varies.  Stays up late at night until 3-4 am whether working or not.  Can sleep when tries to do so but can accomplish more when things are quiet at night.  Has poor dietary habits.  Little appetite throughout the day before having a large dinner.  No recent changes.  Good energy during the day.  ADHD medication helps him focus his energy.  Can work 24-48 hours straight if he wants to do so.  Energy can be unusually high at times.  Periods of depressed mood but not currently.  Can last for up to a week at a time.  Usually last 3-4 days and precipitated by an event that gets him spiraling out of control.  No panic attacks.  Gets anxious in large crowds and being touched (can't get away).  Becomes irritated and impatient when anxious (resolution needs to happen immediately. Stays stressed, mostly about business and work.  No general anxiety but has significant social anxiety.  Doesn't have anticipatory anxiety but becomes highly stressed when a negative event occurs. Very introverted, even when around people he cares about.  Obsessive and compulsive counting, locking doors, and excessive checking garage & car doors.  Can attend but has trouble deciding what to pays attention to.  Pays attentions to what is most interesting for him rather than what's mos important.  Easily distracted, poor organization (spends more time developing organization systems than  actually being organized).  Restless/fidgety, impulsive.  Doesn't interact with most people his age.  Awkward around others.  Can act engaging when feels that he has to be but doesn't feel comfortable around them.  Only two close friends who he know through wife's friends.  Gets anxious in social situations before they occur.  Doesn't like to make eye contact and has some trouble picking up social cues when not looking at them.  No repetitive speech but repetitive behavior (throat clearing, spitting).  Interests are overly  intense (wants to be immediate expert in field of interest) e.g. beekeeping coding, marketing, music like a fixation.  Struggles with change and transition.  Sensory sensitivities - oral, noise               ? ?Risk Assessment: ?Danger to Self:  No ?Self-injurious Behavior: No ?Danger to Others: No ?Duty to Warn:no ?Physical Aggression / Violence:No  ?Access to Firearms a concern: No  ?Gang Involvement:No  ?Patient / guardian was educated about steps to take if suicide or homicide risk level increases between visits: N/A ?While future psychiatric events cannot be accurately predicted, the patient does not currently require acute inpatient psychiatric care and does not currently meet [facility] involuntary commitment criteria. ? ?Substance Abuse History: ?Current substance abuse: No   drinks a few beers every other night when doing activities alone or when going out on date with wife.   ? ?Past Psychiatric History:   ?Previous psychological history is significant for ADHD, anxiety, and depression ?Outpatient Providers:None ?History of Psych Hospitalization: No  ?Psychological Testing:  None   ? ?Abuse History:  ?Victim of: Yes.  , emotional  - grew up in a house with a hoarder which was traumatic and embarrassing for patient but not direct abuse ?Report needed: No. ?Victim of Neglect:No. ?Perpetrator of  None   ?Witness / Exposure to Domestic Violence: No   ?Protective Services Involvement: No  ?Witness to [employer] Violence:  No  ? ?Family History:  ?Family History  ?Problem Relation Age of Onset  ? Diabetes Other   ? Hyperlipidemia Other   ? Cancer Other   ?     PGM ? digestive  ? Kidney disease Other   ? Squamous cell carcinoma Father   ? High blood pressure Father   ? Hyperlipidemia Father   ?Family history of ADHD, ASD, anxiety, OCD (hoarding) and depression ? ?Living situation: the patient lives with their spouse and two children (41 and 7 years).  Good relations with wife and children. ? ?Sexual  Orientation: Straight ? ?Relationship Status: married  ?Name of spouse / other:Kevin Mueller ?If a parent, number of children / ages:13 and 7 ? ?Support Systems: spouse ?friends ? ?Financial Stress:  No  ? 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  Previously worked as an Location manager, Teacher, early years/pre, Child psychotherapist.  Great challenges include trying to hard for perfection and regulating work behavior as well as having difficulty with interpersonal dynamics of working with others.    ? ?Military Service: No  ? 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  Engineer, agricultural.  Bachelors in [place].  Performed very well academically great memory.   ? ?Religion/Sprituality/World View: ?Believes in higher power but doesn't know what it is.  Tends to be more agnostic ? ?Any cultural differences  that may affect / interfere with treatment:  not applicable  ? ?Recreation/Hobbies: Varies - currently beekeeping, prior to that it was mining for gold, and cord making, and crystal making.   ? ?Stressors: Health problems   ?Occupational concerns   ? ?Strengths: Intelligence, creativity, skilled in many areas  ? ?Barriers:  Lack of focus, listening/understanding, and organization ? ?Legal History: ?Pending legal issue / charges: The patient has no significant history of legal issues. ?History of legal issue / charges:  None ? ?Medical History/Surgical History: reviewed ?Past Medical History:  ?Diagnosis Date  ? Elevated blood pressure (not hypertension)   ? Hamartoma ([place])   ? Hamartoma of lung ([place]) 10/20/2012  ? Hyperlipidemia   ? IBS (irritable bowel syndrome)   ? Insomnia 09/26/2012  ? Primary hypertension 11/16/2020  ? ? ?Past Surgical History:  ?Procedure Laterality Date  ? LUNG BIOPSY    ? FNA under  xray guidance 1998 @ [facility]  ? ? ?Medications: ?Current Outpatient Medications  ?Medication Sig Dispense Refill  ? ALPRAZolam (XANAX) 0.25 MG tablet Take 1 tablet (0.25 mg total) by mouth 2 (two) times daily as

## 2021-09-19 NOTE — Progress Notes (Signed)
                Justa Hatchell, PhD 

## 2021-10-07 ENCOUNTER — Other Ambulatory Visit: Payer: Self-pay | Admitting: Family Medicine

## 2021-10-07 DIAGNOSIS — F418 Other specified anxiety disorders: Secondary | ICD-10-CM

## 2021-10-11 ENCOUNTER — Encounter: Payer: Self-pay | Admitting: Family Medicine

## 2021-10-11 ENCOUNTER — Other Ambulatory Visit: Payer: Self-pay | Admitting: Family Medicine

## 2021-10-11 DIAGNOSIS — F988 Other specified behavioral and emotional disorders with onset usually occurring in childhood and adolescence: Secondary | ICD-10-CM

## 2021-10-12 ENCOUNTER — Other Ambulatory Visit: Payer: Self-pay | Admitting: Family Medicine

## 2021-10-12 DIAGNOSIS — F988 Other specified behavioral and emotional disorders with onset usually occurring in childhood and adolescence: Secondary | ICD-10-CM

## 2021-10-12 MED ORDER — AMPHETAMINE-DEXTROAMPHETAMINE 20 MG PO TABS: ORAL_TABLET | ORAL | 0 refills | Status: DC

## 2021-10-12 NOTE — Telephone Encounter (Signed)
Requesting: Adderall  ?Contract: 08/08/2021 ?UDS: 08/08/2021 ?Last OV: 09/04/2021 ?Next OV:  N/A ?Last Refill: 09/04/2021, #[id number] RF ?Database: ? ? ?Please advise  ? ?

## 2021-10-16 ENCOUNTER — Encounter: Payer: Self-pay | Admitting: Family Medicine

## 2021-10-29 ENCOUNTER — Encounter: Payer: Self-pay | Admitting: *Deleted

## 2021-10-31 ENCOUNTER — Encounter: Payer: Self-pay | Admitting: Psychology

## 2021-10-31 ENCOUNTER — Ambulatory Visit (INDEPENDENT_AMBULATORY_CARE_PROVIDER_SITE_OTHER): Payer: 59 | Admitting: Psychology

## 2021-10-31 DIAGNOSIS — F902 Attention-deficit hyperactivity disorder, combined type: Secondary | ICD-10-CM

## 2021-10-31 DIAGNOSIS — F3341 Major depressive disorder, recurrent, in partial remission: Secondary | ICD-10-CM

## 2021-10-31 DIAGNOSIS — F4010 Social phobia, unspecified: Secondary | ICD-10-CM

## 2021-10-31 NOTE — Progress Notes (Signed)
                Litsy Epting, PhD 

## 2021-10-31 NOTE — Progress Notes (Signed)
[facility] Counselor/Therapist Progress Note ? ?Patient ID: Kevin Mueller, MRN: [id number],   ? ?Date: 10/31/2021 ? ?Time Spent: 12:00 - 3:30pm  ? ?Treatment Type: Testing ?Met with patient for testing session.  Patient was at the clinic and session was conducted from therapist's office in person  ? ?Reported Symptoms: Reason for Visit /Presenting Problem: Patient presents with history of anxiety and depression.  He has been previously diagnosed with ADHD but experiences difficulty with social interaction difficulty overly intense interests, resistance to change, and sensory hypersensitivity along with perfectionism and compulsive behavior.  Testing recommended to evaluate for Autism spectrum disorder along with other conditions that may be affection social interaction and emotion/behavior regulation.   ? ?Mental Status Exam: ?Appearance:  Neat and Well Groomed     ?Behavior: Appropriate  ?Motor: Normal  ?Speech/Language:  Clear and Coherent and Normal Rate  ?Affect: Appropriate  ?Mood: normal  ?Thought process: Typical  ?Thought content:   WNL  ?Sensory/Perceptual disturbances:   WNL  ?Orientation: oriented to person, place, time/date, and situation  ?Attention: Good  ?Concentration: Fair  ?Memory: WNL  ?Fund of knowledge:  Good  ?Insight:   Good  ?Judgment:  Good  ?Impulse Control: Good  ? ?Risk Assessment: ?Danger to Self:  No ?Self-injurious Behavior: No ?Danger to Others: No ? ?Behavior Observations: Patient was cooperative and displayed good effort. Attention and concentration were inconsistent overall, as patient exhibited several instances each of self-correction and asking questions to be repeated.  She occasionally solved problems correctly after the standardized time limit and missed several relatively easy problems or questions.  Mood was euthymic with appropriate affect.  The results appear representative of current functioning.   ? ?Subjective: Testing included the K-BIT 2 (0.75 hrs.  for testing and scoring) along with the CNS Vital signs (0.75 hrs.) and ADOS 2 Module 4 (1.5 hrs.).  Patient and spouse completed the SRS-2 (0.5 hrs).    ? ?Diagnosis:Attention deficit hyperactivity disorder (ADHD), combined type ? ?Social anxiety disorder ? ?Recurrent major depression in partial remission ([place]) ? ?Plan: Testing complete. Report writing to be conducted followed by  interactive feedback next session.   ? ?Rainey Pines, PhD ? ? ? ?

## 2021-11-09 ENCOUNTER — Other Ambulatory Visit (HOSPITAL_BASED_OUTPATIENT_CLINIC_OR_DEPARTMENT_OTHER): Payer: Self-pay

## 2021-11-09 ENCOUNTER — Encounter: Payer: Self-pay | Admitting: Psychology

## 2021-11-09 NOTE — Progress Notes (Signed)
KENDEL BESSEY is a 46 y.o. male patient Report writing competed ( 3 hrs.).  Interactive feedback to be conducted next session. Report to be attached to the feedback progress note.         Rainey Pines, PhD

## 2021-11-25 ENCOUNTER — Encounter: Payer: Self-pay | Admitting: Family Medicine

## 2021-11-26 ENCOUNTER — Other Ambulatory Visit: Payer: Self-pay

## 2021-11-26 DIAGNOSIS — E785 Hyperlipidemia, unspecified: Secondary | ICD-10-CM

## 2021-11-26 MED ORDER — ROSUVASTATIN CALCIUM 10 MG PO TABS: ORAL_TABLET | ORAL | 1 refills | Status: DC

## 2021-11-29 ENCOUNTER — Encounter: Payer: Self-pay | Admitting: Psychology

## 2021-11-29 ENCOUNTER — Ambulatory Visit (INDEPENDENT_AMBULATORY_CARE_PROVIDER_SITE_OTHER): Payer: 59 | Admitting: Psychology

## 2021-11-29 DIAGNOSIS — F908 Attention-deficit hyperactivity disorder, other type: Secondary | ICD-10-CM

## 2021-11-29 DIAGNOSIS — F840 Autistic disorder: Secondary | ICD-10-CM | POA: Diagnosis not present

## 2021-11-29 DIAGNOSIS — F331 Major depressive disorder, recurrent, moderate: Secondary | ICD-10-CM

## 2021-11-29 NOTE — Progress Notes (Signed)
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 demands that may be impacting positively or negatively on executive functioning. Evaluation of psychological (e.g., mood, personality), physical (e.g., fatigue, pain), and environmental (e.g., availability of caregivers/supervisors/teachers, other resources) factors that may affect the ability to learn and/or apply compensatory strategies.                  Rainey Pines, PhD

## 2021-11-29 NOTE — Progress Notes (Signed)
[facility]rap[facility] Progress Note  Patient ID: JANELLE CULTON, MRN: [id number],    Date: 11/29/2021  Time Spent: 3-3:45pm    Treatment Type:  Testing - Feedback Session  Met with patient and spouse to review results of testing.  Patient and spouse were at home and session was conducted from therapist's office via video conferencing.  Patient and spouse verbally consented to telehealth.       Reported Symptoms: Patient presents with history of anxiety and depression.  He has been previously diagnosed with ADHD but experiences difficulty with social interaction difficulty overly intense interests, resistance to change, and sensory hypersensitivity along with perfectionism and compulsive behavior.  Testing recommended to evaluate for Autism spectrum disorder along with other conditions that may be affection social interaction and emotion/behavior regulation.   Subjective: Interactive feedback was conducted (1 hr.).  It was discussed how patient met the criterion for Autism Spectrum Disorder and ADHD along with how these conditions affect his ability to complete work and relate to others.      Recommendations included discussing results with PCP, developing a visual organization system, and seeking individual counseling to help manage emotion and learn social and organizational skills.  Patient expressed agreement with the results and recommendations.     Total Time of Testing: 7.5 hrs. Testing and Scoring: 3.5 hrs. Interactive Feedback:1 hr. Report Writing: 3 hrs.      Diagnosis:Autism spectrum disorder  Attention deficit hyperactivity disorder (ADHD), other type  Major depressive disorder, recurrent episode, moderate ([place])  Plan: Report to be sent to parent and referring provider.    Rainey Pines, PhD

## 2021-11-29 NOTE — Progress Notes (Signed)
                Charon Akamine, PhD 

## 2021-12-06 ENCOUNTER — Other Ambulatory Visit: Payer: Self-pay | Admitting: Family Medicine

## 2021-12-06 DIAGNOSIS — F988 Other specified behavioral and emotional disorders with onset usually occurring in childhood and adolescence: Secondary | ICD-10-CM

## 2021-12-07 MED ORDER — AMPHETAMINE-DEXTROAMPHETAMINE 20 MG PO TABS: ORAL_TABLET | ORAL | 0 refills | Status: DC

## 2021-12-07 NOTE — Telephone Encounter (Signed)
Requesting: Adderall '20mg'$   Contract: 08/08/21 UDS: 08/08/21 Last Visit: 09/04/21 Next Visit: None Last Refill: 10/12/21 #90 and 0RF   Please Advise

## 2022-01-09 ENCOUNTER — Other Ambulatory Visit: Payer: Self-pay | Admitting: Family Medicine

## 2022-01-09 DIAGNOSIS — F988 Other specified behavioral and emotional disorders with onset usually occurring in childhood and adolescence: Secondary | ICD-10-CM

## 2022-01-09 NOTE — Telephone Encounter (Signed)
Requesting: Adderall '20mg'$  Contract: 08/08/21 UDS: 08/08/21 Last Visit: 09/04/21 Next Visit: none Last Refill: 12/07/21  Please Advise

## 2022-01-10 MED ORDER — AMPHETAMINE-DEXTROAMPHETAMINE 20 MG PO TABS: ORAL_TABLET | ORAL | 0 refills | Status: DC

## 2022-01-22 ENCOUNTER — Other Ambulatory Visit: Payer: Self-pay | Admitting: Family Medicine

## 2022-01-22 ENCOUNTER — Encounter: Payer: Self-pay | Admitting: Family Medicine

## 2022-01-22 MED ORDER — EPINEPHRINE 0.3 MG/0.3ML IJ SOAJ: 0.3000 mg | INTRAMUSCULAR | 0 refills | Status: DC | PRN

## 2022-02-26 ENCOUNTER — Other Ambulatory Visit: Payer: Self-pay | Admitting: Family Medicine

## 2022-02-26 DIAGNOSIS — F988 Other specified behavioral and emotional disorders with onset usually occurring in childhood and adolescence: Secondary | ICD-10-CM

## 2022-02-27 MED ORDER — AMPHETAMINE-DEXTROAMPHETAMINE 20 MG PO TABS: ORAL_TABLET | ORAL | 0 refills | Status: DC

## 2022-02-27 NOTE — Telephone Encounter (Signed)
Requesting: Adderall '20mg'$  Contract: 08/08/21 UDS: 08/08/21 Last Visit: 09/04/21 Next Visit: none Last Refill: 01/10/22   Please Advise

## 2022-03-09 ENCOUNTER — Other Ambulatory Visit: Payer: Self-pay | Admitting: Family Medicine

## 2022-03-09 DIAGNOSIS — E785 Hyperlipidemia, unspecified: Secondary | ICD-10-CM

## 2022-03-09 DIAGNOSIS — F418 Other specified anxiety disorders: Secondary | ICD-10-CM

## 2022-04-03 ENCOUNTER — Other Ambulatory Visit: Payer: Self-pay | Admitting: Family Medicine

## 2022-04-03 DIAGNOSIS — F988 Other specified behavioral and emotional disorders with onset usually occurring in childhood and adolescence: Secondary | ICD-10-CM

## 2022-04-04 MED ORDER — AMPHETAMINE-DEXTROAMPHETAMINE 20 MG PO TABS: ORAL_TABLET | ORAL | 0 refills | Status: DC

## 2022-04-04 NOTE — Telephone Encounter (Signed)
Requesting: Adderall '20mg'$   Contract:08/08/21 UDS:08/08/21 Last Visit: 09/04/21 Next Visit: None Last Refill: 02/27/22 #90 and 0RF   Please Advise

## 2022-05-13 ENCOUNTER — Other Ambulatory Visit: Payer: Self-pay | Admitting: Family Medicine

## 2022-05-13 DIAGNOSIS — F988 Other specified behavioral and emotional disorders with onset usually occurring in childhood and adolescence: Secondary | ICD-10-CM

## 2022-05-13 MED ORDER — AMPHETAMINE-DEXTROAMPHETAMINE 20 MG PO TABS: ORAL_TABLET | ORAL | 0 refills | Status: DC

## 2022-05-13 NOTE — Telephone Encounter (Signed)
Requesting: ADDERALL  Contract: 08/08/2021 UDS: 08/08/2021 Last Visit: 09/04/2021 Next Visit: N/A Last Refill: 04/04/2022  Please Advise

## 2022-06-14 ENCOUNTER — Other Ambulatory Visit: Payer: Self-pay | Admitting: Family Medicine

## 2022-06-14 DIAGNOSIS — F411 Generalized anxiety disorder: Secondary | ICD-10-CM

## 2022-06-28 ENCOUNTER — Encounter: Payer: Self-pay | Admitting: Family Medicine

## 2022-06-28 DIAGNOSIS — F988 Other specified behavioral and emotional disorders with onset usually occurring in childhood and adolescence: Secondary | ICD-10-CM

## 2022-06-28 MED ORDER — AMPHETAMINE-DEXTROAMPHETAMINE 20 MG PO TABS
ORAL_TABLET | ORAL | 0 refills | Status: DC
Start: 2022-06-28 — End: 2022-09-17

## 2022-06-28 NOTE — Telephone Encounter (Signed)
Requesting: Adderall Contract: 08/08/2021 UDS: 08/08/2021 Last OV: 09/04/2021 Next OV: N/A Last Refill: 05/13/2022, #90--0 RF Database:   Please advise

## 2022-07-01 ENCOUNTER — Encounter: Payer: Self-pay | Admitting: Family Medicine

## 2022-08-15 ENCOUNTER — Other Ambulatory Visit: Payer: Self-pay | Admitting: Family Medicine

## 2022-08-15 DIAGNOSIS — E785 Hyperlipidemia, unspecified: Secondary | ICD-10-CM

## 2022-08-15 DIAGNOSIS — I10 Essential (primary) hypertension: Secondary | ICD-10-CM

## 2022-08-16 ENCOUNTER — Other Ambulatory Visit: Payer: Self-pay | Admitting: Family Medicine

## 2022-08-16 DIAGNOSIS — E785 Hyperlipidemia, unspecified: Secondary | ICD-10-CM

## 2022-09-09 ENCOUNTER — Other Ambulatory Visit: Payer: Self-pay | Admitting: Family Medicine

## 2022-09-09 DIAGNOSIS — F988 Other specified behavioral and emotional disorders with onset usually occurring in childhood and adolescence: Secondary | ICD-10-CM

## 2022-09-14 ENCOUNTER — Other Ambulatory Visit: Payer: Self-pay | Admitting: Family Medicine

## 2022-09-14 DIAGNOSIS — E785 Hyperlipidemia, unspecified: Secondary | ICD-10-CM

## 2022-09-17 ENCOUNTER — Other Ambulatory Visit: Payer: Self-pay | Admitting: Family Medicine

## 2022-09-17 DIAGNOSIS — F988 Other specified behavioral and emotional disorders with onset usually occurring in childhood and adolescence: Secondary | ICD-10-CM

## 2022-09-17 MED ORDER — AMPHETAMINE-DEXTROAMPHETAMINE 20 MG PO TABS: ORAL_TABLET | ORAL | 0 refills | Status: DC

## 2022-09-17 NOTE — Telephone Encounter (Signed)
Requesting: Adderall 20mg   Contract:  08/08/21 UDS: 08/08/21 Last Visit: 09/04/21 Next Visit: None Last Refill: 06/28/22 #90 and 0RF   Please Advise

## 2022-09-26 ENCOUNTER — Other Ambulatory Visit: Payer: Self-pay | Admitting: Family Medicine

## 2022-09-26 DIAGNOSIS — E785 Hyperlipidemia, unspecified: Secondary | ICD-10-CM

## 2022-10-08 ENCOUNTER — Ambulatory Visit (INDEPENDENT_AMBULATORY_CARE_PROVIDER_SITE_OTHER): Payer: Self-pay | Admitting: Family Medicine

## 2022-10-08 ENCOUNTER — Encounter: Payer: Self-pay | Admitting: Family Medicine

## 2022-10-08 VITALS — BP 118/80 | HR 75 | Temp 98.4°F | Resp 12 | Ht 70.0 in | Wt 250.8 lb

## 2022-10-08 DIAGNOSIS — L309 Dermatitis, unspecified: Secondary | ICD-10-CM | POA: Insufficient documentation

## 2022-10-08 DIAGNOSIS — F418 Other specified anxiety disorders: Secondary | ICD-10-CM

## 2022-10-08 DIAGNOSIS — E785 Hyperlipidemia, unspecified: Secondary | ICD-10-CM

## 2022-10-08 DIAGNOSIS — F411 Generalized anxiety disorder: Secondary | ICD-10-CM

## 2022-10-08 DIAGNOSIS — I10 Essential (primary) hypertension: Secondary | ICD-10-CM

## 2022-10-08 DIAGNOSIS — F845 Asperger's syndrome: Secondary | ICD-10-CM

## 2022-10-08 MED ORDER — ESCITALOPRAM OXALATE 20 MG PO TABS: ORAL_TABLET | ORAL | 1 refills | Status: DC

## 2022-10-08 MED ORDER — TRIAMCINOLONE ACETONIDE 0.1 % EX CREA
1.0000 | TOPICAL_CREAM | Freq: Two times a day (BID) | CUTANEOUS | 0 refills | Status: DC
Start: 2022-10-08 — End: 2023-10-31

## 2022-10-08 MED ORDER — CICLOPIROX 1 % EX SHAM
1.0000 | MEDICATED_SHAMPOO | Freq: Every day | CUTANEOUS | 0 refills | Status: AC
Start: 2022-10-08 — End: ?

## 2022-10-08 MED ORDER — ROSUVASTATIN CALCIUM 10 MG PO TABS: ORAL_TABLET | ORAL | 1 refills | Status: DC

## 2022-10-08 MED ORDER — LISINOPRIL 20 MG PO TABS: 20.0000 mg | ORAL_TABLET | Freq: Every day | ORAL | 1 refills | Status: DC

## 2022-10-08 NOTE — Progress Notes (Signed)
Established Patient Office Visit  Subjective   Patient ID: Kevin Mueller, male    DOB: 1975/08/13  Age: 47 y.o. MRN: [id number]  Chief Complaint  Patient presents with   Medication Refill    HPI Pt here to f/u anxiety and depression.   Patient Active Problem List   Diagnosis Date Noted   Asperger's disorder 10/08/2022   Eczema 10/08/2022   Anxiety state 10/08/2022   Depression with anxiety 08/08/2021   Morbid obesity 08/08/2021   Primary hypertension 11/16/2020   Ganglion cyst of dorsum of left wrist 02/22/2020   Preventative health care 02/16/2016   Otitis media 10/01/2013   Hamartoma of lung 10/20/2012   Insomnia 09/26/2012   ADD (attention deficit disorder) 12/18/2010   Hip pain, bilateral 12/18/2010   TRANSAMINASES, SERUM, ELEVATED 07/05/2010   NEVI, MULTIPLE 06/08/2010   HIP PAIN, LEFT, CHRONIC 06/08/2010   IBS 04/11/2008   Hyperlipidemia LDL goal <100 05/12/2007   ELEVATED BLOOD PRESSURE WITHOUT DIAGNOSIS OF HYPERTENSION 04/06/2007   Past Medical History:  Diagnosis Date   Elevated blood pressure (not hypertension)    Hamartoma    Hamartoma of lung 10/20/2012   Hyperlipidemia    IBS (irritable bowel syndrome)    Insomnia 09/26/2012   Primary hypertension 11/16/2020   Past Surgical History:  Procedure Laterality Date   LUNG BIOPSY     FNA under xray guidance 1998 @ [facility]   Social History   Tobacco Use   Smoking status: Former    Packs/day: 2    Types: Cigarettes    Start date: 10/01/2006   Smokeless tobacco: Never  Substance Use Topics   Alcohol use: Yes    Alcohol/week: 28.0 standard drinks of alcohol    Types: 28 Cans of beer per week   Drug use: No   Social History   Socioeconomic History   Marital status: Married    Spouse name: Not on file   Number of children: Not on file   Years of education: Not on file   Highest education level: Not on file  Occupational History   Occupation: [employer]   Tobacco Use   Smoking  status: Former    Packs/day: 2    Types: Cigarettes    Start date: 10/01/2006   Smokeless tobacco: Never  Substance and Sexual Activity   Alcohol use: Yes    Alcohol/week: 28.0 standard drinks of alcohol    Types: 28 Cans of beer per week   Drug use: No   Sexual activity: Yes    Partners: Female  Other Topics Concern   Not on file  Social History Narrative   Exercise---  Some weights,  Running after kids   Social Determinants of Health   Financial Resource Strain: Not on file  Food Insecurity: Not on file  Transportation Needs: Not on file  Physical Activity: Not on file  Stress: Not on file  Social Connections: Not on file  Intimate Partner Violence: Not on file   Family Status  Relation Name Status   Other  (Not Specified)   Other  (Not Specified)   Other  (Not Specified)   Other  (Not Specified)   Mother  Alive   Father  Alive   Family History  Problem Relation Age of Onset   Diabetes Other    Hyperlipidemia Other    Cancer Other        PGM ? digestive   Kidney disease Other    Squamous cell carcinoma Father  High blood pressure Father    Hyperlipidemia Father    Allergies  Allergen Reactions   Codeine     Adverse reaction, per pt   Simvastatin       ROS    Objective:     BP 118/80 (BP Location: Left Arm, Cuff Size: Large)   Pulse 75   Temp 98.4 F (36.9 C) (Oral)   Resp 12   Ht 5\' 10"  (1.778 m)   Wt 250 lb 12.8 oz (113.8 kg)   SpO2 97%   BMI 35.99 kg/m  BP Readings from Last 3 Encounters:  10/08/22 118/80  09/04/21 (!) 145/82  08/07/21 (!) 142/97   Wt Readings from Last 3 Encounters:  10/08/22 250 lb 12.8 oz (113.8 kg)  09/04/21 253 lb 3.2 oz (114.9 kg)  08/07/21 251 lb (113.9 kg)   SpO2 Readings from Last 3 Encounters:  10/08/22 97%  09/04/21 100%  08/07/21 99%      Physical Exam   No results found for any visits on 10/08/22.  Last CBC Lab Results  Component Value Date   WBC 3.9 (L) 08/08/2021   HGB 14.7 08/08/2021    HCT 43.4 08/08/2021   MCV 90.7 08/08/2021   MCH 29.7 11/30/2012   RDW 13.4 08/08/2021   PLT 259.0 08/08/2021   Last metabolic panel Lab Results  Component Value Date   GLUCOSE 114 (H) 08/08/2021   NA 140 08/08/2021   K 5.2 (H) 08/08/2021   CL 103 08/08/2021   CO2 31 08/08/2021   BUN 12 08/08/2021   CREATININE 0.97 08/08/2021   CALCIUM 9.8 08/08/2021   PHOS 3.7 11/30/2012   PROT 7.7 08/08/2021   ALBUMIN 4.7 08/08/2021   BILITOT 0.6 08/08/2021   ALKPHOS 57 08/08/2021   AST 28 08/08/2021   ALT 44 08/08/2021   Last lipids Lab Results  Component Value Date   CHOL 268 (H) 08/08/2021   HDL 45.80 08/08/2021   LDLCALC 193 (H) 08/08/2021   LDLDIRECT 108.0 04/01/2017   TRIG 145.0 08/08/2021   CHOLHDL 6 08/08/2021   Last hemoglobin A1c Lab Results  Component Value Date   HGBA1C 5.7 08/13/2021   Last thyroid functions Lab Results  Component Value Date   TSH 1.99 08/08/2021   Last vitamin D Lab Results  Component Value Date   VD25OH 10.22 (L) 08/08/2021   Last vitamin B12 and Folate Lab Results  Component Value Date   VITAMINB12 298 08/08/2021      The 10-year ASCVD risk score (Arnett DK, et al., 2019) is: 4.5%    Assessment & Plan:   Problem List Items Addressed This Visit       Unprioritized   Primary hypertension   Relevant Medications   lisinopril (ZESTRIL) 20 MG tablet   rosuvastatin (CRESTOR) 10 MG tablet   Other Relevant Orders   Comprehensive metabolic panel   Hyperlipidemia LDL goal <100    Tolerating statin, encouraged heart healthy diet, avoid trans fats, minimize simple carbs and saturated fats. Increase exercise as tolerated       Relevant Medications   lisinopril (ZESTRIL) 20 MG tablet   rosuvastatin (CRESTOR) 10 MG tablet   Eczema    Can use ciclopirox on scalp / beard Triamcinolone for other areas      Relevant Medications   Ciclopirox 1 % shampoo   triamcinolone cream (KENALOG) 0.1 %   Depression with anxiety    Restart  lexapro 20 mg daily Con't abilify Con' t counseling F/u 1 month or  sooner as needed       Relevant Medications   escitalopram (LEXAPRO) 20 MG tablet   Asperger's disorder - Primary    Con't with counselor  Pt is on abilify       Anxiety state   Relevant Medications   escitalopram (LEXAPRO) 20 MG tablet   Other Visit Diagnoses     Hyperlipidemia, unspecified hyperlipidemia type       Relevant Medications   lisinopril (ZESTRIL) 20 MG tablet   rosuvastatin (CRESTOR) 10 MG tablet   Other Relevant Orders   Lipid panel   Comprehensive metabolic panel       No follow-ups on file.    Donato Schultz, DO

## 2022-10-08 NOTE — Patient Instructions (Signed)

## 2022-10-08 NOTE — Assessment & Plan Note (Signed)
Con't with counselor  Pt is on abilify

## 2022-10-08 NOTE — Assessment & Plan Note (Signed)
Restart lexapro 20 mg daily Con't abilify Con' t counseling F/u 1 month or sooner as needed

## 2022-10-08 NOTE — Assessment & Plan Note (Signed)
Tolerating statin, encouraged heart healthy diet, avoid trans fats, minimize simple carbs and saturated fats. Increase exercise as tolerated 

## 2022-10-08 NOTE — Assessment & Plan Note (Signed)
Can use ciclopirox on scalp / beard Triamcinolone for other areas

## 2022-10-09 LAB — LIPID PANEL
Cholesterol: 175 mg/dL (ref 0–200)
HDL: 41 mg/dL (ref >39.00)
LDL Cholesterol: 107 mg/dL — ABNORMAL HIGH (ref 0–99)
NonHDL: 133.71
Total CHOL/HDL Ratio: 4
Triglycerides: 136 mg/dL (ref 0.0–149.0)
VLDL: 27.2 mg/dL (ref 0.0–40.0)

## 2022-10-09 LAB — COMPREHENSIVE METABOLIC PANEL
ALT: 68 U/L — ABNORMAL HIGH (ref 0–53)
AST: 46 U/L — ABNORMAL HIGH (ref 0–37)
Albumin: 4.6 g/dL (ref 3.5–5.2)
Alkaline Phosphatase: 45 U/L (ref 39–117)
BUN: 15 mg/dL (ref 6–23)
CO2: 28 mEq/L (ref 19–32)
Calcium: 9.7 mg/dL (ref 8.4–10.5)
Chloride: 103 mEq/L (ref 96–112)
Creatinine, Ser: 1.18 mg/dL (ref 0.40–1.50)
GFR: 73.68 mL/min (ref >60.00)
Glucose, Bld: 97 mg/dL (ref 70–99)
Potassium: 4.7 mEq/L (ref 3.5–5.1)
Sodium: 138 mEq/L (ref 135–145)
Total Bilirubin: 0.7 mg/dL (ref 0.2–1.2)
Total Protein: 7.2 g/dL (ref 6.0–8.3)

## 2022-10-15 ENCOUNTER — Other Ambulatory Visit: Payer: Self-pay | Admitting: *Deleted

## 2022-10-15 DIAGNOSIS — F418 Other specified anxiety disorders: Secondary | ICD-10-CM

## 2022-10-15 MED ORDER — ARIPIPRAZOLE 5 MG PO TABS
ORAL_TABLET | ORAL | 1 refills | Status: DC
Start: 2022-10-15 — End: 2023-05-15

## 2022-10-27 ENCOUNTER — Other Ambulatory Visit: Payer: Self-pay | Admitting: Family Medicine

## 2022-10-27 DIAGNOSIS — E785 Hyperlipidemia, unspecified: Secondary | ICD-10-CM

## 2022-11-27 ENCOUNTER — Other Ambulatory Visit: Payer: Self-pay | Admitting: Family Medicine

## 2022-11-27 DIAGNOSIS — E785 Hyperlipidemia, unspecified: Secondary | ICD-10-CM

## 2022-12-23 ENCOUNTER — Other Ambulatory Visit: Payer: Self-pay | Admitting: Family Medicine

## 2022-12-23 DIAGNOSIS — F988 Other specified behavioral and emotional disorders with onset usually occurring in childhood and adolescence: Secondary | ICD-10-CM

## 2022-12-27 ENCOUNTER — Encounter: Payer: Self-pay | Admitting: Family Medicine

## 2022-12-27 ENCOUNTER — Telehealth: Payer: Self-pay | Admitting: Family Medicine

## 2022-12-27 DIAGNOSIS — Z1211 Encounter for screening for malignant neoplasm of colon: Secondary | ICD-10-CM

## 2022-12-27 DIAGNOSIS — R7989 Other specified abnormal findings of blood chemistry: Secondary | ICD-10-CM | POA: Diagnosis not present

## 2022-12-27 DIAGNOSIS — E785 Hyperlipidemia, unspecified: Secondary | ICD-10-CM

## 2022-12-27 DIAGNOSIS — F988 Other specified behavioral and emotional disorders with onset usually occurring in childhood and adolescence: Secondary | ICD-10-CM | POA: Diagnosis not present

## 2022-12-27 DIAGNOSIS — F845 Asperger's syndrome: Secondary | ICD-10-CM

## 2022-12-27 MED ORDER — ROSUVASTATIN CALCIUM 10 MG PO TABS
ORAL_TABLET | ORAL | 1 refills | Status: DC
Start: 2022-12-27 — End: 2023-07-28

## 2022-12-27 MED ORDER — AMPHETAMINE-DEXTROAMPHETAMINE 30 MG PO TABS
30.0000 mg | ORAL_TABLET | Freq: Three times a day (TID) | ORAL | 0 refills | Status: DC
Start: 2022-12-27 — End: 2022-12-27

## 2022-12-27 MED ORDER — AMPHETAMINE-DEXTROAMPHETAMINE 30 MG PO TABS: 30.0000 mg | ORAL_TABLET | Freq: Three times a day (TID) | ORAL | 0 refills | Status: DC

## 2022-12-27 NOTE — Addendum Note (Signed)
Addended by: Seabron Spates R on: 12/27/2022 12:05 PM   Modules accepted: Orders

## 2022-12-27 NOTE — Progress Notes (Signed)
MyChart Video Visit    Virtual Visit via Video Note   This patient is at least at moderate risk for complications without adequate follow up. This format is felt to be most appropriate for this patient at this time. Physical exam was limited by quality of the video and audio technology used for the visit. heather was able to get the patient set up on a video visit.  Patient location: home Patient , his wife and provider in visit   I discussed the limitations of evaluation and management by telemedicine and the availability of in person appointments. The patient expressed understanding and agreed to proceed.  Visit Date: 12/27/2022  Today's healthcare provider: Donato Schultz, DO     Subjective:    Patient ID: Kevin Mueller, male    DOB: Jun 11, 1976, 47 y.o.   MRN: [id number]  Chief Complaint  Patient presents with   Hypertension   Hyperlipidemia   Follow-up    HPI Patient is in today for f/u add and depression.   Discussed the use of AI scribe software for clinical note transcription with the patient, who gave verbal consent to proceed.  History of Present Illness   The patient, with a history of depression and ADHD, reports that the Lexapro has been very beneficial. However, they note that it seems to counteract the effects of their Adderall. They have been taking 1.5 doses of Adderall at a time, or 30 mg, which has been more effective than the prescribed 20 mg three times a day. They request an increase in their Adderall prescription.  The patient also mentions that they have been prescribed rosuvastatin for high cholesterol, but their new insurance requires prior authorization for this medication. They are unsure which of their other medications need refills and plan to request refills through the MyChart app as needed.  In addition, the patient has been found to have low testosterone levels and is seeking a referral to a urologist for further evaluation and  potential treatment. They also express interest in discussing a vasectomy with the urologist. Lastly, the patient requests a referral for a colonoscopy.      Past Medical History:  Diagnosis Date   Elevated blood pressure (not hypertension)    Hamartoma (HCC)    Hamartoma of lung (HCC) 10/20/2012   Hyperlipidemia    IBS (irritable bowel syndrome)    Insomnia 09/26/2012   Primary hypertension 11/16/2020    Past Surgical History:  Procedure Laterality Date   LUNG BIOPSY     FNA under xray guidance 1998 @ [facility]    Family History  Problem Relation Age of Onset   Diabetes Other    Hyperlipidemia Other    Cancer Other        PGM ? digestive   Kidney disease Other    Squamous cell carcinoma Father    High blood pressure Father    Hyperlipidemia Father     Social History   Socioeconomic History   Marital status: Married    Spouse name: Not on file   Number of children: Not on file   Years of education: Not on file   Highest education level: Not on file  Occupational History   Occupation: [employer]   Tobacco Use   Smoking status: Former    Packs/day: 2    Types: Cigarettes    Start date: 10/01/2006   Smokeless tobacco: Never  Substance and Sexual Activity   Alcohol use: Yes  Alcohol/week: 28.0 standard drinks of alcohol    Types: 28 Cans of beer per week   Drug use: No   Sexual activity: Yes    Partners: Female  Other Topics Concern   Not on file  Social History Narrative   Exercise---  Some weights,  Running after kids   Social Determinants of Health   Financial Resource Strain: Not on file  Food Insecurity: Not on file  Transportation Needs: Not on file  Physical Activity: Not on file  Stress: Not on file  Social Connections: Not on file  Intimate Partner Violence: Not on file    Outpatient Medications Prior to Visit  Medication Sig Dispense Refill   ALPRAZolam (XANAX) 0.25 MG tablet Take 1 tablet (0.25 mg total) by mouth 2 (two)  times daily as needed for anxiety. 20 tablet 0   ARIPiprazole (ABILIFY) 5 MG tablet TAKE 1 TABLET(5 MG) BY MOUTH DAILY 90 tablet 1   Ciclopirox 1 % shampoo Apply 1 Dose topically at bedtime. 120 mL 0   EPINEPHrine (EPIPEN 2-PAK) 0.3 mg/0.3 mL IJ SOAJ injection Inject 0.3 mg into the muscle as needed for anaphylaxis. 1 each 0   escitalopram (LEXAPRO) 20 MG tablet TAKE 1 TABLET(20 MG) BY MOUTH DAILY 90 tablet 1   fenofibrate 160 MG tablet TAKE 1 TABLET(160 MG) BY MOUTH DAILY. NEED FURTHER EVALUATION FOR FURTHER REFILLS 90 tablet 1   KRILL OIL PO Take by mouth.     lisinopril (ZESTRIL) 20 MG tablet Take 1 tablet (20 mg total) by mouth daily. 90 tablet 1   triamcinolone cream (KENALOG) 0.1 % Apply 1 Application topically 2 (two) times daily. 30 g 0   amphetamine-dextroamphetamine (ADDERALL) 20 MG tablet 1 po tid 90 tablet 0   rosuvastatin (CRESTOR) 10 MG tablet TAKE 1 TABLET BY MOUTH DAILY. Pt needs a office visit for further refills. 90 tablet 1   No facility-administered medications prior to visit.    Allergies  Allergen Reactions   Codeine     Adverse reaction, per pt   Simvastatin     Review of Systems  Constitutional:  Negative for chills, fever and malaise/fatigue.  HENT:  Negative for congestion and hearing loss.   Eyes:  Negative for discharge.  Respiratory:  Negative for cough, sputum production and shortness of breath.   Cardiovascular:  Negative for chest pain, palpitations and leg swelling.  Gastrointestinal:  Negative for abdominal pain, blood in stool, constipation, diarrhea, heartburn, nausea and vomiting.  Genitourinary:  Negative for dysuria, frequency, hematuria and urgency.  Musculoskeletal:  Negative for back pain, falls and myalgias.  Skin:  Negative for rash.  Neurological:  Negative for dizziness, sensory change, loss of consciousness, weakness and headaches.  Endo/Heme/Allergies:  Negative for environmental allergies. Does not bruise/bleed easily.   Psychiatric/Behavioral:  Negative for depression and suicidal ideas. The patient is not nervous/anxious and does not have insomnia.        Objective:    Physical Exam Vitals and nursing note reviewed.  Constitutional:      General: He is not in acute distress.    Appearance: Normal appearance. He is well-developed.  Pulmonary:     Effort: Pulmonary effort is normal.  Neurological:     Mental Status: He is alert and oriented to person, place, and time.  Psychiatric:        Mood and Affect: Mood normal.        Behavior: Behavior normal.        Thought Content: Thought content  normal.        Judgment: Judgment normal.     There were no vitals taken for this visit. Wt Readings from Last 3 Encounters:  10/08/22 250 lb 12.8 oz (113.8 kg)  09/04/21 253 lb 3.2 oz (114.9 kg)  08/07/21 251 lb (113.9 kg)       Assessment & Plan:  Attention deficit disorder (ADD) without hyperactivity -     Amphetamine-Dextroamphetamine; Take 1 tablet by mouth 3 (three) times daily.  Dispense: 90 tablet; Refill: 0  Asperger's disorder  Colon cancer screening -     Ambulatory referral to Gastroenterology  Low testosterone -     Ambulatory referral to Urology  Hyperlipidemia, unspecified hyperlipidemia type -     Rosuvastatin Calcium; TAKE 1 TABLET BY MOUTH DAILY. Pt needs a office visit for further refills.  Dispense: 90 tablet; Refill: 1  Assessment and Plan    ADHD: Patient reports that current Adderall dose (20mg  TID) is insufficient, and has been self-adjusting to 30mg  TID with improvement in symptoms. -Increase Adderall to 30mg  TID. -Send prescription to [facility] on [address].  Depression: Patient reports improvement with Lexapro. -Continue current regimen.  Hyperlipidemia: Patient on rosuvastatin, requires prior authorization. -Complete prior authorization for rosuvastatin.  Hypogonadism: Patient reports low testosterone levels. -Refer to urology for further evaluation and  management.  Colonoscopy: Patient requests colonoscopy. -Refer to [facility] for colonoscopy.  Vasectomy: Patient expresses interest in vasectomy. -Discuss with urologist during referral visit for hypogonadism.  Follow-up in 6 months unless issues arise with medication changes.         I discussed the assessment and treatment plan with the patient. The patient was provided an opportunity to ask questions and all were answered. The patient agreed with the plan and demonstrated an understanding of the instructions.   The patient was advised to call back or seek an in-person evaluation if the symptoms worsen or if the condition fails to improve as anticipated.  Donato Schultz, DO [facility] [phone] (phone) [phone] (fax)  [facility]

## 2022-12-27 NOTE — Telephone Encounter (Signed)
PA initiated via Covermymeds; KEY: [id number]. Awaiting determination.

## 2022-12-30 ENCOUNTER — Other Ambulatory Visit: Payer: Self-pay | Admitting: Family Medicine

## 2022-12-30 ENCOUNTER — Encounter: Payer: Self-pay | Admitting: Family Medicine

## 2022-12-30 DIAGNOSIS — E785 Hyperlipidemia, unspecified: Secondary | ICD-10-CM

## 2022-12-30 DIAGNOSIS — F988 Other specified behavioral and emotional disorders with onset usually occurring in childhood and adolescence: Secondary | ICD-10-CM

## 2022-12-30 MED ORDER — AMPHETAMINE-DEXTROAMPHETAMINE 30 MG PO TABS: 30.0000 mg | ORAL_TABLET | Freq: Three times a day (TID) | ORAL | 0 refills | Status: DC

## 2022-12-30 MED ORDER — FENOFIBRATE 160 MG PO TABS
160.0000 mg | ORAL_TABLET | Freq: Every day | ORAL | 1 refills | Status: DC
Start: 2022-12-30 — End: 2023-07-14

## 2022-12-30 NOTE — Telephone Encounter (Signed)
PA approved. Effective 12/28/2022 - 12/28/2023

## 2022-12-30 NOTE — Telephone Encounter (Signed)
Can you resend? Pharmacy didn't receive.

## 2022-12-31 ENCOUNTER — Telehealth: Payer: Self-pay | Admitting: Family Medicine

## 2022-12-31 NOTE — Telephone Encounter (Signed)
Returned call. Confirmed dosage

## 2022-12-31 NOTE — Telephone Encounter (Signed)
[facility] pharmacy called and had questions on the dosing of the adderall 30 mg, please call  [phone]

## 2023-01-08 ENCOUNTER — Ambulatory Visit: Payer: 59 | Admitting: Urology

## 2023-01-08 ENCOUNTER — Encounter: Payer: Self-pay | Admitting: Urology

## 2023-01-08 VITALS — BP 129/88 | HR 92 | Ht 70.5 in | Wt 248.0 lb

## 2023-01-08 DIAGNOSIS — E291 Testicular hypofunction: Secondary | ICD-10-CM | POA: Diagnosis not present

## 2023-01-08 NOTE — Progress Notes (Signed)
Assessment: 1. Hypogonadism in male      Plan: Patient also interested in Vasectomy Will obtain testosterone with complete hormone panel early morning visit next few days with a follow-up testosterone in 1 week or also early morning draw.  I will see patient back following blood work and make further recommendations for possible testosterone replacement therapy.  We did talk about that in some detail today including options for replacement.  Patient had numerous questions today which I answered.  Chief Complaint: low T  History of Present Illness:  Kevin Mueller is a 47 y.o. male who is seen in consultation from Zola Button, [place] R, DO for evaluation of suspected hypogonadism. Patient does report significant hypogonadal symptoms including marked loss of libido, fatigue, insomnia and poor exercise tolerance and recovery. He had a testosterone drawn a year ago which was 168.  He has had no follow-up laboratory studies or history of prior replacement therapy.  Patient is also interested in considering vasa ectomy.  Past Medical History:  Past Medical History:  Diagnosis Date   Elevated blood pressure (not hypertension)    Hamartoma (HCC)    Hamartoma of lung (HCC) 10/20/2012   Hyperlipidemia    IBS (irritable bowel syndrome)    Insomnia 09/26/2012   Primary hypertension 11/16/2020    Past Surgical History:  Past Surgical History:  Procedure Laterality Date   LUNG BIOPSY     FNA under xray guidance 1998 @ [facility]    Allergies:  Allergies  Allergen Reactions   Codeine     Adverse reaction, per pt   Simvastatin     Family History:  Family History  Problem Relation Age of Onset   Diabetes Other    Hyperlipidemia Other    Cancer Other        PGM ? digestive   Kidney disease Other    Squamous cell carcinoma Father    High blood pressure Father    Hyperlipidemia Father     Social History:  Social History   Tobacco Use   Smoking status: Former    Current  packs/day: 2.00    Average packs/day: 2.0 packs/day for 16.3 years (32.5 ttl pk-yrs)    Types: Cigarettes    Start date: 10/01/2006   Smokeless tobacco: Never  Substance Use Topics   Alcohol use: Yes    Alcohol/week: 28.0 standard drinks of alcohol    Types: 28 Cans of beer per week   Drug use: No    Review of symptoms:  Constitutional:  Negative for unexplained weight loss, night sweats, fever, chills ENT:  Negative for nose bleeds, sinus pain, painful swallowing CV:  Negative for chest pain, shortness of breath, exercise intolerance, palpitations, loss of consciousness Resp:  Negative for cough, wheezing, shortness of breath GI:  Negative for nausea, vomiting, diarrhea, bloody stools GU:  Positives noted in HPI; otherwise negative for gross hematuria, dysuria, urinary incontinence Neuro:  Negative for seizures, poor balance, limb weakness, slurred speech Psych:  Negative for lack of energy, depression, anxiety Endocrine:  Negative for polydipsia, polyuria, symptoms of hypoglycemia (dizziness, hunger, sweating) Hematologic:  Negative for anemia, purpura, petechia, prolonged or excessive bleeding, use of anticoagulants  Allergic:  Negative for difficulty breathing or choking as a result of exposure to anything; no shellfish allergy; no allergic response (rash/itch) to materials, foods  Physical exam: BP 129/88   Pulse 92   Ht 5' 10.5" (1.791 m)   Wt 248 lb (112.5 kg)   BMI 35.08 kg/m  GENERAL APPEARANCE:  Well appearing, well developed, well nourished, NAD

## 2023-01-09 ENCOUNTER — Other Ambulatory Visit: Payer: 59

## 2023-01-09 DIAGNOSIS — E291 Testicular hypofunction: Secondary | ICD-10-CM

## 2023-01-10 LAB — PROLACTIN: Prolactin: 2.1 ng/mL — ABNORMAL LOW (ref 3.9–22.7)

## 2023-01-10 LAB — TESTOSTERONE: Testosterone: 258 ng/dL — ABNORMAL LOW (ref 264–916)

## 2023-01-10 LAB — SEX HORMONE BINDING GLOBULIN: Sex Hormone Binding: 16.7 nmol/L (ref 16.5–55.9)

## 2023-01-10 LAB — LUTEINIZING HORMONE: LH: 5.6 m[IU]/mL (ref 1.7–8.6)

## 2023-01-16 ENCOUNTER — Other Ambulatory Visit: Payer: 59

## 2023-01-16 DIAGNOSIS — E291 Testicular hypofunction: Secondary | ICD-10-CM

## 2023-01-20 ENCOUNTER — Encounter: Payer: Self-pay | Admitting: Internal Medicine

## 2023-01-23 ENCOUNTER — Encounter: Payer: Self-pay | Admitting: Urology

## 2023-01-23 ENCOUNTER — Ambulatory Visit (INDEPENDENT_AMBULATORY_CARE_PROVIDER_SITE_OTHER): Payer: 59 | Admitting: Urology

## 2023-01-23 VITALS — BP 109/76 | HR 71 | Ht 70.5 in | Wt 246.0 lb

## 2023-01-23 DIAGNOSIS — E291 Testicular hypofunction: Secondary | ICD-10-CM

## 2023-01-23 DIAGNOSIS — Z3009 Encounter for other general counseling and advice on contraception: Secondary | ICD-10-CM

## 2023-01-23 NOTE — Addendum Note (Signed)
Addended by: Joline Maxcy on: 01/23/2023 04:26 PM   Modules accepted: Orders

## 2023-01-23 NOTE — Progress Notes (Addendum)
Assessment: 1. Hypogonadism in male   2. Consultation for sterilization     Plan: Today I had a long discussion with the patient regarding his hypogonadism Patient elects to begin testosterone replacement therapy Rx: 20% bioidentical cream-1 mL daily Follow-up T level in 4-6 weeks  Today I also had a long discussion with the patient regarding vasa ectomy. As to the procedure, no scalpel technique vasectomy is explained and reviewed in detail.  Generalized risks including but not limited to bleeding, infection, orchalgia, testicular atrophy, epididymitis, scrotal hematoma, and chronic pain are discussed.   Additionally, he understands that the possibility of vas recanalization following vasectomy is possible although rare.  Most importantly, the patient understands that he is not sterile initially and will need a semen analysis check to confirm sterility such that no sperm are seen.  He is advised to avoid ejaculation for 10 days following the procedure.  The initial semen analysis will be checked in approximately 12 weeks and in some patients, several months may be required for clearance of all sperm.  He reports a clear understanding of the need for continued birth control until sterility is confirmed.  Otherwise, general issues regarding local anesthesia, prep, alprazolam are discussed and he reports a clear understanding.   Will schedule Vaectomy next available.  Patient has a active prescription for Xanax and will take 1 to 2 mg prior to the procedure.  Chief Complaint: No chief complaint on file.   HPI: Kevin Mueller is a 47 y.o. male who presents for continued evaluation of hypogonadism. Patient does report significant hypogonadal symptoms including marked loss of libido, fatigue, insomnia and poor exercise tolerance and recovery. He had a testosterone drawn a year ago which was 168.    Patient has recently completed full hormone panel (testosterone values were 258 and 283 1 week  apart; SHBG = 16.7, prolactin = 2.1, LH normal, albumin 4.6) which does confirm low testosterone.  It is borderline and his bioavailable testosterone was actually 185 which is in the low normal range.  Portions of the above documentation were copied from a prior visit for review purposes only.  Allergies: Allergies  Allergen Reactions   Codeine     Adverse reaction, per pt   Simvastatin     PMH: Past Medical History:  Diagnosis Date   Elevated blood pressure (not hypertension)    Hamartoma (HCC)    Hamartoma of lung (HCC) 10/20/2012   Hyperlipidemia    IBS (irritable bowel syndrome)    Insomnia 09/26/2012   Primary hypertension 11/16/2020    PSH: Past Surgical History:  Procedure Laterality Date   LUNG BIOPSY     FNA under xray guidance 1998 @ [facility]    SH: Social History   Tobacco Use   Smoking status: Former    Current packs/day: 2.00    Average packs/day: 2.0 packs/day for 16.3 years (32.6 ttl pk-yrs)    Types: Cigarettes    Start date: 10/01/2006   Smokeless tobacco: Never  Substance Use Topics   Alcohol use: Yes    Alcohol/week: 28.0 standard drinks of alcohol    Types: 28 Cans of beer per week   Drug use: No    ROS: Constitutional:  Negative for fever, chills, weight loss CV: Negative for chest pain, previous MI, hypertension Respiratory:  Negative for shortness of breath, wheezing, sleep apnea, frequent cough GI:  Negative for nausea, vomiting, bloody stool, GERD  PE: BP 109/76   Pulse 71   Ht 5' 10.5" (  1.791 m)   Wt 246 lb (111.6 kg)   BMI 34.80 kg/m  GENERAL APPEARANCE:  Well appearing, well developed, well nourished, NAD  GU: Normal external genitalia.  Bilateral vasa easily palpable

## 2023-01-28 ENCOUNTER — Telehealth: Payer: Self-pay

## 2023-01-28 NOTE — Telephone Encounter (Signed)
-----   Message from Joline Maxcy sent at 01/23/2023  4:25 PM EDT ----- Regarding: xanax Please call him.  He has a prescription for Xanax 0.25 mg.  I discussed with him that he could take it 1 to 2 hours prior to the scheduled vasectomy.  Please let him know that I would not take more than 1 mg or 4 of those tablets. Thanks

## 2023-01-28 NOTE — Telephone Encounter (Signed)
Left msg for a return call from patient to discuss.

## 2023-01-29 NOTE — Telephone Encounter (Signed)
Left 2nd msg for a return call from patient.

## 2023-02-03 ENCOUNTER — Other Ambulatory Visit: Payer: Self-pay | Admitting: Family Medicine

## 2023-02-03 DIAGNOSIS — F988 Other specified behavioral and emotional disorders with onset usually occurring in childhood and adolescence: Secondary | ICD-10-CM

## 2023-02-04 NOTE — Telephone Encounter (Signed)
Requesting:amphetamine-dextroamphetamine (ADDERALL) 30 MG tablet  Sig: Take 1 tablet by mouth 3 (three) times daily.  Contract: 08/08/2021 UDS: 08/08/2021 Last Visit: 12/27/2022 Next Visit: none Last Refill: 12/30/2022 #90 no refills   Please Advise

## 2023-02-05 MED ORDER — AMPHETAMINE-DEXTROAMPHETAMINE 30 MG PO TABS
30.0000 mg | ORAL_TABLET | Freq: Three times a day (TID) | ORAL | 0 refills | Status: DC
Start: 2023-02-05 — End: 2023-03-17

## 2023-02-05 NOTE — Telephone Encounter (Signed)
Spoke with patient regarding the premed prior to the procedure. Pt verbalized understanding.

## 2023-02-11 ENCOUNTER — Ambulatory Visit (AMBULATORY_SURGERY_CENTER): Payer: 59

## 2023-02-11 VITALS — Ht 70.05 in | Wt 258.0 lb

## 2023-02-11 DIAGNOSIS — Z1211 Encounter for screening for malignant neoplasm of colon: Secondary | ICD-10-CM

## 2023-02-11 MED ORDER — NA SULFATE-K SULFATE-MG SULF 17.5-3.13-1.6 GM/177ML PO SOLN: 1.0000 | Freq: Once | ORAL | 0 refills | Status: AC

## 2023-02-11 NOTE — Progress Notes (Signed)
No egg or soy allergy known to patient  No issues known to pt with past sedation with any surgeries or procedures Patient denies ever being told they had issues or difficulty with intubation  No FH of Malignant Hyperthermia Pt is not on diet pills Pt is not on  home 02  Pt is not on blood thinners  Pt states recent issues with chronic constipation; tries to consume fiber; usually 2-day constipation, which patient states is unusual d/t lifelong diarrhea  No A fib or A flutter Have any cardiac testing pending--no Patient's chart reviewed by Cathlyn Parsons CNRA prior to previsit and patient appropriate for the [facility].  Previsit completed and red dot placed by patient's name on their procedure day (on provider's schedule).   Ambulates independently  Pt instructed to use Singlecare.com or GoodRx for a price reduction on prep

## 2023-02-26 ENCOUNTER — Encounter: Payer: Self-pay | Admitting: Internal Medicine

## 2023-02-26 ENCOUNTER — Other Ambulatory Visit (HOSPITAL_BASED_OUTPATIENT_CLINIC_OR_DEPARTMENT_OTHER): Payer: Self-pay

## 2023-03-04 ENCOUNTER — Encounter: Payer: 59 | Admitting: Urology

## 2023-03-12 ENCOUNTER — Ambulatory Visit: Payer: 59 | Admitting: Internal Medicine

## 2023-03-12 ENCOUNTER — Encounter: Payer: Self-pay | Admitting: Internal Medicine

## 2023-03-12 VITALS — BP 114/70 | HR 67 | Temp 98.4°F | Resp 16 | Ht 70.0 in | Wt 258.0 lb

## 2023-03-12 DIAGNOSIS — Z1211 Encounter for screening for malignant neoplasm of colon: Secondary | ICD-10-CM

## 2023-03-12 DIAGNOSIS — D123 Benign neoplasm of transverse colon: Secondary | ICD-10-CM

## 2023-03-12 DIAGNOSIS — K635 Polyp of colon: Secondary | ICD-10-CM | POA: Diagnosis not present

## 2023-03-12 MED ORDER — SODIUM CHLORIDE 0.9 % IV SOLN: 500.0000 mL | Freq: Once | INTRAVENOUS | Status: DC

## 2023-03-12 MED ORDER — HYDROCORTISONE (PERIANAL) 2.5 % EX CREA: 1.0000 | TOPICAL_CREAM | Freq: Two times a day (BID) | CUTANEOUS | 0 refills | Status: AC

## 2023-03-12 NOTE — Progress Notes (Signed)
Called to room to assist during endoscopic procedure.  Patient ID and intended procedure confirmed with present staff. Received instructions for my participation in the procedure from the performing physician.  

## 2023-03-12 NOTE — Op Note (Signed)
[facility] Patient Name: Kevin Mueller Procedure Date: 03/12/2023 10:48 AM MRN: [id number] Endoscopist: Kevin Mueller , , [id number] Age: 47 Referring MD:  Date of Birth: 1975/10/28 Gender: Male Account #: [id number] Procedure:                Colonoscopy Indications:              Screening for colorectal malignant neoplasm Medicines:                Monitored Anesthesia Care Procedure:                Pre-Anesthesia Assessment:                           - Prior to the procedure, a History and Physical                            was performed, and patient medications and                            allergies were reviewed. The patient's tolerance of                            previous anesthesia was also reviewed. The risks                            and benefits of the procedure and the sedation                            options and risks were discussed with the patient.                            All questions were answered, and informed consent                            was obtained. Prior Anticoagulants: The patient has                            taken no anticoagulant or antiplatelet agents. ASA                            Grade Assessment: II - A patient with mild systemic                            disease. After reviewing the risks and benefits,                            the patient was deemed in satisfactory condition to                            undergo the procedure.                           After obtaining informed consent, the colonoscope  was passed under direct vision. Throughout the                            procedure, the patient's blood pressure, pulse, and                            oxygen saturations were monitored continuously. The                            [id number] Olympus Scope was introduced through the                            anus and advanced to the the terminal ileum. The                            colonoscopy  was performed without difficulty. The                            patient tolerated the procedure well. The quality                            of the bowel preparation was excellent. The                            terminal ileum, ileocecal valve, appendiceal                            orifice, and rectum were photographed. Scope In: 10:55:45 AM Scope Out: 11:13:24 AM Scope Withdrawal Time: 0 hours 14 minutes 1 second  Total Procedure Duration: 0 hours 17 minutes 39 seconds  Findings:                 The terminal ileum appeared normal.                           Multiple diverticula were found in the sigmoid                            colon, descending colon and ascending colon.                           Three sessile polyps were found in the transverse                            colon. The polyps were 3 to 6 mm in size. These                            polyps were removed with a cold snare. Resection                            and retrieval were complete.                           Non-bleeding internal hemorrhoids were found during  retroflexion. Complications:            No immediate complications. Estimated Blood Loss:     Estimated blood loss was minimal. Impression:               - The examined portion of the ileum was normal.                           - Diverticulosis in the sigmoid colon, in the                            descending colon and in the ascending colon.                           - Three 3 to 6 mm polyps in the transverse colon,                            removed with a cold snare. Resected and retrieved.                           - Non-bleeding internal hemorrhoids. Recommendation:           - Discharge patient to home (with escort).                           - Await pathology results.                           - Anusol HC cream BID for 7 days.                           - Avoid constipation. Okay to use a daily stool                             softener or laxative to prevent worsening of                            constipation.                           - The findings and recommendations were discussed                            with the patient. Dr Kevin Mueller "Kevin Mueller" Kevin Mueller,  03/12/2023 11:18:29 AM

## 2023-03-12 NOTE — Patient Instructions (Addendum)
Handouts Provided:  Polyps  Use Anusol HC cream twice daily for 7 Days  Avoid constipation.  Okay to use a daily stool softener or laxative to prevent worsening of constipation.  YOU HAD AN ENDOSCOPIC PROCEDURE TODAY AT THE [facility]:   Refer to the procedure report that was given to you for any specific questions about what was found during the examination.  If the procedure report does not answer your questions, please call your gastroenterologist to clarify.  If you requested that your care partner not be given the details of your procedure findings, then the procedure report has been included in a sealed envelope for you to review at your convenience later.  YOU SHOULD EXPECT: Some feelings of bloating in the abdomen. Passage of more gas than usual.  Walking can help get rid of the air that was put into your GI tract during the procedure and reduce the bloating. If you had a lower endoscopy (such as a colonoscopy or flexible sigmoidoscopy) you may notice spotting of blood in your stool or on the toilet paper. If you underwent a bowel prep for your procedure, you may not have a normal bowel movement for a few days.  Please Note:  You might notice some irritation and congestion in your nose or some drainage.  This is from the oxygen used during your procedure.  There is no need for concern and it should clear up in a day or so.  SYMPTOMS TO REPORT IMMEDIATELY:  Following lower endoscopy (colonoscopy or flexible sigmoidoscopy):  Excessive amounts of blood in the stool  Significant tenderness or worsening of abdominal pains  Swelling of the abdomen that is new, acute  Fever of 100F or higher  For urgent or emergent issues, a gastroenterologist can be reached at any hour by calling [phone]. Do not use MyChart messaging for urgent concerns.    DIET:  We do recommend a small meal at first, but then you may proceed to your regular diet.  Drink plenty of fluids but you  should avoid alcoholic beverages for 24 hours.  ACTIVITY:  You should plan to take it easy for the rest of today and you should NOT DRIVE or use heavy machinery until tomorrow (because of the sedation medicines used during the test).    FOLLOW UP: Our staff will call the number listed on your records the next business day following your procedure.  We will call around 7:15- 8:00 am to check on you and address any questions or concerns that you may have regarding the information given to you following your procedure. If we do not reach you, we will leave a message.     If any biopsies were taken you will be contacted by phone or by letter within the next 1-3 weeks.  Please call us at [phone] if you have not heard about the biopsies in 3 weeks.    SIGNATURES/CONFIDENTIALITY: You and/or your care partner have signed paperwork which will be entered into your electronic medical record.  These signatures attest to the fact that that the information above on your After Visit Summary has been reviewed and is understood.  Full responsibility of the confidentiality of this discharge information lies with you and/or your care-partner.

## 2023-03-12 NOTE — Progress Notes (Signed)
Pt's states no medical or surgical changes since previsit or office visit. 

## 2023-03-12 NOTE — Progress Notes (Signed)
GASTROENTEROLOGY PROCEDURE H&P NOTE   Primary Care Physician: Donato Schultz, DO    Reason for Procedure:   Colon cancer screening  Plan:    Colonoscopy  Patient is appropriate for endoscopic procedure(s) in the ambulatory ([facility]) setting.  The nature of the procedure, as well as the risks, benefits, and alternatives were carefully and thoroughly reviewed with the patient. Ample time for discussion and questions allowed. The patient understood, was satisfied, and agreed to proceed.     HPI: Kevin Mueller is a 47 y.o. male who presents for colonoscopy for colon cancer screening. He does see bright red blood in his stools on occasion. He does stay constipated. He has history of anal fissure and hemorrhoids. Denies prior hemorrhoidectomy or banding. Denies changes in bowel habits or unintentional weight loss. Denies family history of colon cancer. Last colonoscopy in 2007 was normal.   Past Medical History:  Diagnosis Date   Elevated blood pressure (not hypertension)    Hamartoma (HCC)    Hamartoma of lung (HCC) 10/20/2012   Hyperlipidemia    IBS (irritable bowel syndrome)    Insomnia 09/26/2012   Primary hypertension 11/16/2020    Past Surgical History:  Procedure Laterality Date   COLONOSCOPY     LUNG BIOPSY     FNA under xray guidance 1998 @ [facility]   SIGMOIDOSCOPY      Prior to Admission medications   Medication Sig Start Date End Date Taking? Authorizing Provider  ALPRAZolam (XANAX) 0.25 MG tablet Take 1 tablet (0.25 mg total) by mouth 2 (two) times daily as needed for anxiety. 08/07/21   Seabron Spates R, DO  amphetamine-dextroamphetamine (ADDERALL) 30 MG tablet Take 1 tablet by mouth 3 (three) times daily. 02/05/23   Seabron Spates R, DO  ARIPiprazole (ABILIFY) 5 MG tablet TAKE 1 TABLET(5 MG) BY MOUTH DAILY 10/15/22   Zola Button, Grayling Congress, DO  Ciclopirox 1 % shampoo Apply 1 Dose topically at bedtime. 10/08/22   Donato Schultz, DO  EPINEPHrine  (EPIPEN 2-PAK) 0.3 mg/0.3 mL IJ SOAJ injection Inject 0.3 mg into the muscle as needed for anaphylaxis. Patient not taking: Reported on 02/11/2023 01/22/22   Zola Button, Myrene Buddy R, DO  escitalopram (LEXAPRO) 20 MG tablet TAKE 1 TABLET(20 MG) BY MOUTH DAILY 10/08/22   Zola Button, Grayling Congress, DO  fenofibrate 160 MG tablet Take 1 tablet (160 mg total) by mouth daily. 12/30/22   Zola Button, Yvonne R, DO  KRILL OIL PO Take by mouth. Patient not taking: Reported on 02/11/2023    [provider]  lisinopril (ZESTRIL) 20 MG tablet Take 1 tablet (20 mg total) by mouth daily. 10/08/22   Seabron Spates R, DO  rosuvastatin (CRESTOR) 10 MG tablet TAKE 1 TABLET BY MOUTH DAILY. Pt needs a office visit for further refills. 12/27/22   Donato Schultz, DO  testosterone (ANDROGEL) 50 MG/5GM (1%) GEL Place 5 g onto the skin daily.    [provider]  triamcinolone cream (KENALOG) 0.1 % Apply 1 Application topically 2 (two) times daily. Patient not taking: Reported on 02/11/2023 10/08/22   Donato Schultz, DO    Current Outpatient Medications  Medication Sig Dispense Refill   amphetamine-dextroamphetamine (ADDERALL) 30 MG tablet Take 1 tablet by mouth 3 (three) times daily. 90 tablet 0   ARIPiprazole (ABILIFY) 5 MG tablet TAKE 1 TABLET(5 MG) BY MOUTH DAILY 90 tablet 1   Ciclopirox 1 % shampoo Apply 1 Dose topically at  bedtime. 120 mL 0   escitalopram (LEXAPRO) 20 MG tablet TAKE 1 TABLET(20 MG) BY MOUTH DAILY 90 tablet 1   fenofibrate 160 MG tablet Take 1 tablet (160 mg total) by mouth daily. 90 tablet 1   lisinopril (ZESTRIL) 20 MG tablet Take 1 tablet (20 mg total) by mouth daily. 90 tablet 1   rosuvastatin (CRESTOR) 10 MG tablet TAKE 1 TABLET BY MOUTH DAILY. Pt needs a office visit for further refills. 90 tablet 1   ALPRAZolam (XANAX) 0.25 MG tablet Take 1 tablet (0.25 mg total) by mouth 2 (two) times daily as needed for anxiety. 20 tablet 0   EPINEPHrine (EPIPEN 2-PAK) 0.3 mg/0.3 mL IJ  SOAJ injection Inject 0.3 mg into the muscle as needed for anaphylaxis. (Patient not taking: Reported on 02/11/2023) 1 each 0   KRILL OIL PO Take by mouth. (Patient not taking: Reported on 02/11/2023)     testosterone (ANDROGEL) 50 MG/5GM (1%) GEL Place 5 g onto the skin daily.     triamcinolone cream (KENALOG) 0.1 % Apply 1 Application topically 2 (two) times daily. (Patient not taking: Reported on 02/11/2023) 30 g 0   Current Facility-Administered Medications  Medication Dose Route Frequency Provider Last Rate Last Admin   0.9 %  sodium chloride infusion  500 mL Intravenous Once Imogene Burn, MD        Allergies as of 03/12/2023 - Review Complete 03/12/2023  Allergen Reaction Noted   Codeine  11/20/2010   Simvastatin  09/22/2012    Family History  Problem Relation Age of Onset   Squamous cell carcinoma Father    High blood pressure Father    Hyperlipidemia Father    Diabetes Other    Hyperlipidemia Other    Cancer Other        PGM ? digestive   Kidney disease Other    Colon cancer Neg Hx    Colon polyps Neg Hx    Esophageal cancer Neg Hx    Rectal cancer Neg Hx    Stomach cancer Neg Hx     Social History   Socioeconomic History   Marital status: Married    Spouse name: Not on file   Number of children: Not on file   Years of education: Not on file   Highest education level: Not on file  Occupational History   Occupation: [employer]   Tobacco Use   Smoking status: Former    Current packs/day: 2.00    Average packs/day: 2.0 packs/day for 16.4 years (32.9 ttl pk-yrs)    Types: Cigarettes    Start date: 10/01/2006   Smokeless tobacco: Never  Vaping Use   Vaping status: Never Used  Substance and Sexual Activity   Alcohol use: Yes    Alcohol/week: 4.0 standard drinks of alcohol    Types: 4 Cans of beer per week   Drug use: No   Sexual activity: Yes    Partners: Female  Other Topics Concern   Not on file  Social History Narrative   Exercise---   Some weights,  Running after kids   Social Determinants of Health   Financial Resource Strain: Not on file  Food Insecurity: Not on file  Transportation Needs: Not on file  Physical Activity: Not on file  Stress: Not on file  Social Connections: Not on file  Intimate Partner Violence: Not on file    Physical Exam: Vital signs in last 24 hours: BP 94/66   Pulse 79   Temp 98.4  F (36.9 C)   Resp 16   Ht 5\' 10"  (1.778 m)   Wt 258 lb (117 kg)   SpO2 99%   BMI 37.02 kg/m  GEN: NAD EYE: Sclerae anicteric ENT: MMM CV: Non-tachycardic Pulm: No increased work of breathing GI: Soft, NT/ND NEURO:  Alert & Oriented   Eulah Pont, MD [facility]  03/12/2023 11:15 AM

## 2023-03-12 NOTE — Progress Notes (Signed)
Report to PACU, RN, vss, BBS= Clear.  

## 2023-03-13 ENCOUNTER — Telehealth: Payer: Self-pay

## 2023-03-13 NOTE — Telephone Encounter (Signed)
Left message on follow up call. 

## 2023-03-16 LAB — SURGICAL PATHOLOGY

## 2023-03-17 ENCOUNTER — Encounter: Payer: Self-pay | Admitting: Internal Medicine

## 2023-03-17 ENCOUNTER — Other Ambulatory Visit: Payer: Self-pay | Admitting: Family Medicine

## 2023-03-17 DIAGNOSIS — F988 Other specified behavioral and emotional disorders with onset usually occurring in childhood and adolescence: Secondary | ICD-10-CM

## 2023-03-17 MED ORDER — AMPHETAMINE-DEXTROAMPHETAMINE 30 MG PO TABS: 30.0000 mg | ORAL_TABLET | Freq: Three times a day (TID) | ORAL | 0 refills | Status: DC

## 2023-03-17 NOTE — Telephone Encounter (Signed)
Requesting: Adderall 30mg   Contract: 08/08/21 UDS: 08/08/21 Last Visit: 12/27/22 Next Visit: None Last Refill: 02/05/23 #90 and 0RF   Please Advise

## 2023-04-24 ENCOUNTER — Other Ambulatory Visit: Payer: Self-pay | Admitting: Medical Genetics

## 2023-04-24 DIAGNOSIS — Z006 Encounter for examination for normal comparison and control in clinical research program: Secondary | ICD-10-CM

## 2023-04-27 ENCOUNTER — Encounter: Payer: Self-pay | Admitting: Family Medicine

## 2023-04-27 ENCOUNTER — Other Ambulatory Visit: Payer: Self-pay | Admitting: Family Medicine

## 2023-04-27 DIAGNOSIS — F411 Generalized anxiety disorder: Secondary | ICD-10-CM

## 2023-04-28 ENCOUNTER — Other Ambulatory Visit: Payer: Self-pay | Admitting: Family Medicine

## 2023-04-28 ENCOUNTER — Telehealth: Payer: Self-pay | Admitting: Urology

## 2023-04-28 DIAGNOSIS — F411 Generalized anxiety disorder: Secondary | ICD-10-CM

## 2023-04-28 NOTE — Telephone Encounter (Signed)
Patient called office and LVM stating that he was told he needed to move his 11/13 appt but it looks like everything has been verified and completed. I dont see any notes in the chart stating that we needed to move the appt and it doesnt conflict with any other appts. LVM for patient to call us back with any questions. [place] 04/28/2023

## 2023-05-07 ENCOUNTER — Encounter: Payer: 59 | Admitting: Urology

## 2023-05-15 ENCOUNTER — Encounter: Payer: Self-pay | Admitting: Family Medicine

## 2023-05-15 DIAGNOSIS — F418 Other specified anxiety disorders: Secondary | ICD-10-CM

## 2023-05-15 MED ORDER — ARIPIPRAZOLE 5 MG PO TABS: ORAL_TABLET | ORAL | 1 refills | Status: DC

## 2023-06-13 ENCOUNTER — Other Ambulatory Visit: Payer: Self-pay | Admitting: Family Medicine

## 2023-06-13 ENCOUNTER — Encounter: Payer: Self-pay | Admitting: Family Medicine

## 2023-06-13 DIAGNOSIS — F988 Other specified behavioral and emotional disorders with onset usually occurring in childhood and adolescence: Secondary | ICD-10-CM

## 2023-06-13 DIAGNOSIS — I10 Essential (primary) hypertension: Secondary | ICD-10-CM

## 2023-06-13 MED ORDER — AMPHETAMINE-DEXTROAMPHETAMINE 30 MG PO TABS: 30.0000 mg | ORAL_TABLET | Freq: Three times a day (TID) | ORAL | 0 refills | Status: DC

## 2023-06-13 NOTE — Telephone Encounter (Signed)
Requesting: Adderall Contract: 02/23 UDS: 02/23 Last OV: 12/27/22---virtual visit Next OV: n/a Last Refill: 03/17/23, #90--0RF Database:   Please advise

## 2023-07-12 ENCOUNTER — Other Ambulatory Visit: Payer: Self-pay | Admitting: Family Medicine

## 2023-07-12 DIAGNOSIS — E785 Hyperlipidemia, unspecified: Secondary | ICD-10-CM

## 2023-07-13 ENCOUNTER — Telehealth: Payer: 59 | Admitting: Physician Assistant

## 2023-07-13 DIAGNOSIS — J329 Chronic sinusitis, unspecified: Secondary | ICD-10-CM | POA: Diagnosis not present

## 2023-07-13 MED ORDER — AZITHROMYCIN 250 MG PO TABS: ORAL_TABLET | ORAL | 0 refills | Status: AC

## 2023-07-13 MED ORDER — FLUTICASONE PROPIONATE 50 MCG/ACT NA SUSP: 2.0000 | Freq: Every day | NASAL | 6 refills | Status: AC

## 2023-07-13 NOTE — Patient Instructions (Signed)
[name], thank you for joining [name], PA-C for today's virtual visit.  While this provider is not your primary care provider (PCP), if your PCP is located in our provider database this encounter information will be shared with them immediately following your visit.   A [place] MyChart account gives you access to today's visit and all your visits, tests, and labs performed at [facility] " click here if you don't have a [facility] MyChart account or go to [email or web address]  Consent: (Patient) Kevin Mueller provided verbal consent for this virtual visit at the beginning of the encounter.  Current Medications:  Current Outpatient Medications:    ALPRAZolam (XANAX) 0.25 MG tablet, Take 1 tablet (0.25 mg total) by mouth 2 (two) times daily as needed for anxiety., Disp: 20 tablet, Rfl: 0   amphetamine-dextroamphetamine (ADDERALL) 30 MG tablet, Take 1 tablet by mouth 3 (three) times daily., Disp: 90 tablet, Rfl: 0   ARIPiprazole (ABILIFY) 5 MG tablet, TAKE 1 TABLET(5 MG) BY MOUTH DAILY, Disp: 90 tablet, Rfl: 1   Ciclopirox 1 % shampoo, Apply 1 Dose topically at bedtime., Disp: 120 mL, Rfl: 0   EPINEPHrine (EPIPEN 2-PAK) 0.3 mg/0.3 mL IJ SOAJ injection, Inject 0.3 mg into the muscle as needed for anaphylaxis. (Patient not taking: Reported on 02/11/2023), Disp: 1 each, Rfl: 0   escitalopram (LEXAPRO) 20 MG tablet, Take 1 tablet (20 mg total) by mouth daily., Disp: 90 tablet, Rfl: 1   fenofibrate 160 MG tablet, Take 1 tablet (160 mg total) by mouth daily., Disp: 90 tablet, Rfl: 1   hydrocortisone (ANUSOL-HC) 2.5 % rectal cream, Place 1 Application rectally 2 (two) times daily. Use Twice daily for 7 days., Disp: 30 g, Rfl: 0   KRILL OIL PO, Take by mouth. (Patient not taking: Reported on 02/11/2023), Disp: , Rfl:    lisinopril (ZESTRIL) 20 MG tablet, TAKE 1 TABLET BY MOUTH EVERY DAY, Disp: 90 tablet, Rfl: 1   rosuvastatin (CRESTOR) 10 MG tablet, TAKE 1 TABLET BY  MOUTH DAILY. Pt needs a office visit for further refills., Disp: 90 tablet, Rfl: 1   testosterone (ANDROGEL) 50 MG/5GM (1%) GEL, Place 5 g onto the skin daily., Disp: , Rfl:    triamcinolone cream (KENALOG) 0.1 %, Apply 1 Application topically 2 (two) times daily. (Patient not taking: Reported on 02/11/2023), Disp: 30 g, Rfl: 0   Medications ordered in this encounter:  No orders of the defined types were placed in this encounter.    *If you need refills on other medications prior to your next appointment, please contact your pharmacy*  Follow-Up: Call back or seek an in-person evaluation if the symptoms worsen or if the condition fails to improve as anticipated.  [facility] [phone]  Other Instructions Follow up with PCP ion 2 days.   If you have been instructed to have an in-person evaluation today at a local Urgent Care facility, please use the link below. It will take you to a list of all of our available [facility], including address, phone number and hours of operation. Please do not delay care.  [facility]  If you or a family member do not have a primary care provider, use the link below to schedule a visit and establish care. When you choose a [facility] primary care physician or advanced practice provider, you gain a long-term partner in health. Find a Primary Care Provider  Learn more about 's in-office and  virtual care options: [facility] Get Care Now

## 2023-07-13 NOTE — Progress Notes (Signed)
Virtual Visit Consent   Kevin Mueller, you are scheduled for a virtual visit with a [facility] provider today. Just as with appointments in the office, your consent must be obtained to participate. Your consent will be active for this visit and any virtual visit you may have with one of our providers in the next 365 days. If you have a MyChart account, a copy of this consent can be sent to you electronically.  As this is a virtual visit, video technology does not allow for your provider to perform a traditional examination. This may limit your provider's ability to fully assess your condition. If your provider identifies any concerns that need to be evaluated in person or the need to arrange testing (such as labs, EKG, etc.), we will make arrangements to do so. Although advances in technology are sophisticated, we cannot ensure that it will always work on either your end or our end. If the connection with a video visit is poor, the visit may have to be switched to a telephone visit. With either a video or telephone visit, we are not always able to ensure that we have a secure connection.  By engaging in this virtual visit, you consent to the provision of healthcare and authorize for your insurance to be billed (if applicable) for the services provided during this visit. Depending on your insurance coverage, you may receive a charge related to this service.  I need to obtain your verbal consent now. Are you willing to proceed with your visit today? Kevin Mueller has provided verbal consent on 07/13/2023 for a virtual visit (video or telephone). Kevin Mueller, [place]  Date: 07/13/2023 4:42 PM  Virtual Visit via Video Note   I, Kevin Mueller, connected with  Kevin Mueller  ([id number], 05/27/1976) on 07/13/23 at  4:45 PM EST by a video-enabled telemedicine application and verified that I am speaking with the correct person using two identifiers.  Location: Patient: Virtual Visit Location Patient:  Home Provider: Virtual Visit Location Provider: Home Office   I discussed the limitations of evaluation and management by telemedicine and the availability of in person appointments. The patient expressed understanding and agreed to proceed.    History of Present Illness: Kevin Mueller is a 48 y.o. who identifies as a male who was assigned male at birth, and is being seen today for sinusitis.   HPI:  Sinus Pain Patient complains of congestion, facial pain, nasal congestion, and sore throat. Onset of symptoms was 2 week ago. Symptoms have been gradually worsening since that time. He is drinking plenty of fluids.  Past history is significant for nothing. Patient is former smoker. Problems:  Patient Active Problem List   Diagnosis Date Noted   Asperger's disorder 10/08/2022   Eczema 10/08/2022   Anxiety state 10/08/2022   Depression with anxiety 08/08/2021   Morbid obesity (HCC) 08/08/2021   Primary hypertension 11/16/2020   Ganglion cyst of dorsum of left wrist 02/22/2020   Preventative health care 02/16/2016   Otitis media 10/01/2013   Hamartoma of lung (HCC) 10/20/2012   Insomnia 09/26/2012   ADD (attention deficit disorder) 12/18/2010   Hip pain, bilateral 12/18/2010   TRANSAMINASES, SERUM, ELEVATED 07/05/2010   Benign neoplasm of skin 06/08/2010   HIP PAIN, LEFT, CHRONIC 06/08/2010   IBS 04/11/2008   Hyperlipidemia LDL goal <100 05/12/2007   ELEVATED BLOOD PRESSURE WITHOUT DIAGNOSIS OF HYPERTENSION 04/06/2007    Allergies:  Allergies  Allergen Reactions   Codeine  Adverse reaction, per pt   Simvastatin    Medications:  Current Outpatient Medications:    ALPRAZolam (XANAX) 0.25 MG tablet, Take 1 tablet (0.25 mg total) by mouth 2 (two) times daily as needed for anxiety., Disp: 20 tablet, Rfl: 0   amphetamine-dextroamphetamine (ADDERALL) 30 MG tablet, Take 1 tablet by mouth 3 (three) times daily., Disp: 90 tablet, Rfl: 0   ARIPiprazole (ABILIFY) 5 MG tablet, TAKE 1  TABLET(5 MG) BY MOUTH DAILY, Disp: 90 tablet, Rfl: 1   Ciclopirox 1 % shampoo, Apply 1 Dose topically at bedtime., Disp: 120 mL, Rfl: 0   EPINEPHrine (EPIPEN 2-PAK) 0.3 mg/0.3 mL IJ SOAJ injection, Inject 0.3 mg into the muscle as needed for anaphylaxis. (Patient not taking: Reported on 02/11/2023), Disp: 1 each, Rfl: 0   escitalopram (LEXAPRO) 20 MG tablet, Take 1 tablet (20 mg total) by mouth daily., Disp: 90 tablet, Rfl: 1   fenofibrate 160 MG tablet, Take 1 tablet (160 mg total) by mouth daily., Disp: 90 tablet, Rfl: 1   hydrocortisone (ANUSOL-HC) 2.5 % rectal cream, Place 1 Application rectally 2 (two) times daily. Use Twice daily for 7 days., Disp: 30 g, Rfl: 0   KRILL OIL PO, Take by mouth. (Patient not taking: Reported on 02/11/2023), Disp: , Rfl:    lisinopril (ZESTRIL) 20 MG tablet, TAKE 1 TABLET BY MOUTH EVERY DAY, Disp: 90 tablet, Rfl: 1   rosuvastatin (CRESTOR) 10 MG tablet, TAKE 1 TABLET BY MOUTH DAILY. Pt needs a office visit for further refills., Disp: 90 tablet, Rfl: 1   testosterone (ANDROGEL) 50 MG/5GM (1%) GEL, Place 5 g onto the skin daily., Disp: , Rfl:    triamcinolone cream (KENALOG) 0.1 %, Apply 1 Application topically 2 (two) times daily. (Patient not taking: Reported on 02/11/2023), Disp: 30 g, Rfl: 0  Observations/Objective: Patient is well-developed, well-nourished in no acute distress.  Resting comfortably  at home.  Head is normocephalic, atraumatic.  No labored breathing.  Speech is clear and coherent with logical content.  Patient is alert and oriented at baseline.    Assessment and Plan: 1. Sinusitis, unspecified chronicity, unspecified location (Primary) Presentation was consistent with sinusitis.  No evidence of other bacterial infections including pneumonia, meningitis, pharyngitis, otitis media, deep abscess or cavernous sinus Discussed that this fits the picture of bacterial sinusitis and that due to type and duration of symptoms and exam findings, we will  treat as bacterial sinusitis.  Antibiotics prescribed. Advised to continue ibuprofen and Tylenol at home. .   Advised patient on supportive therapies I discussed with the patient, diagnosis, plan of care, medications and to follow up with the patient's primary physician. The patient was instructed to return if the worsens in any way, especially if not tolerating fluids, increased sinus pain or swelling, worsening headache, persistent fever, difficulty swallowing or breathing, or as needed. The patient agreed with the plan.      Follow Up Instructions: I discussed the assessment and treatment plan with the patient. The patient was provided an opportunity to ask questions and all were answered. The patient agreed with the plan and demonstrated an understanding of the instructions.  A copy of instructions were sent to the patient via MyChart unless otherwise noted below.     The patient was advised to call back or seek an in-person evaluation if the symptoms worsen or if the condition fails to improve as anticipated.    Kevin Kidney, PA-C

## 2023-07-28 ENCOUNTER — Other Ambulatory Visit: Payer: Self-pay | Admitting: Family Medicine

## 2023-07-28 ENCOUNTER — Encounter: Payer: Self-pay | Admitting: Family Medicine

## 2023-07-28 DIAGNOSIS — F988 Other specified behavioral and emotional disorders with onset usually occurring in childhood and adolescence: Secondary | ICD-10-CM

## 2023-07-28 DIAGNOSIS — E785 Hyperlipidemia, unspecified: Secondary | ICD-10-CM

## 2023-07-28 MED ORDER — AMPHETAMINE-DEXTROAMPHETAMINE 30 MG PO TABS: 30.0000 mg | ORAL_TABLET | Freq: Three times a day (TID) | ORAL | 0 refills | Status: DC

## 2023-07-28 MED ORDER — ROSUVASTATIN CALCIUM 10 MG PO TABS: 10.0000 mg | ORAL_TABLET | Freq: Every day | ORAL | 0 refills | Status: DC

## 2023-07-28 NOTE — Telephone Encounter (Signed)
Requesting: Adderall 30mg   Contract: 08/08/21 UDS: 08/08/21 Last Visit: 12/27/22 Next Visit: None Last Refill: 06/13/23 #90 and 0RF   Please Advise

## 2023-09-10 ENCOUNTER — Other Ambulatory Visit: Payer: Self-pay | Admitting: Family Medicine

## 2023-09-10 DIAGNOSIS — F988 Other specified behavioral and emotional disorders with onset usually occurring in childhood and adolescence: Secondary | ICD-10-CM

## 2023-09-10 NOTE — Telephone Encounter (Signed)
 Requesting: Adderall 30 mg Contract: N/A UDS: N/A Last Visit: 07/13/2023 Next Visit: N/A Last Refill: 07/28/2023  Please Advise

## 2023-09-12 MED ORDER — AMPHETAMINE-DEXTROAMPHETAMINE 30 MG PO TABS
30.0000 mg | ORAL_TABLET | Freq: Three times a day (TID) | ORAL | 0 refills | Status: DC
Start: 2023-09-12 — End: 2023-10-31

## 2023-10-29 ENCOUNTER — Other Ambulatory Visit: Payer: Self-pay | Admitting: Family Medicine

## 2023-10-29 DIAGNOSIS — F988 Other specified behavioral and emotional disorders with onset usually occurring in childhood and adolescence: Secondary | ICD-10-CM

## 2023-10-30 ENCOUNTER — Encounter: Payer: Self-pay | Admitting: Family Medicine

## 2023-10-30 ENCOUNTER — Other Ambulatory Visit: Payer: Self-pay | Admitting: Family Medicine

## 2023-10-30 DIAGNOSIS — F411 Generalized anxiety disorder: Secondary | ICD-10-CM

## 2023-10-30 MED ORDER — ESCITALOPRAM OXALATE 20 MG PO TABS: 20.0000 mg | ORAL_TABLET | Freq: Every day | ORAL | 0 refills | Status: DC

## 2023-10-31 ENCOUNTER — Encounter: Payer: Self-pay | Admitting: Family Medicine

## 2023-10-31 ENCOUNTER — Ambulatory Visit: Admitting: Family Medicine

## 2023-10-31 VITALS — BP 108/78 | HR 94 | Temp 98.3°F | Resp 18 | Ht 70.0 in | Wt 253.0 lb

## 2023-10-31 DIAGNOSIS — F988 Other specified behavioral and emotional disorders with onset usually occurring in childhood and adolescence: Secondary | ICD-10-CM | POA: Diagnosis not present

## 2023-10-31 DIAGNOSIS — I10 Essential (primary) hypertension: Secondary | ICD-10-CM

## 2023-10-31 DIAGNOSIS — F845 Asperger's syndrome: Secondary | ICD-10-CM

## 2023-10-31 DIAGNOSIS — Z79899 Other long term (current) drug therapy: Secondary | ICD-10-CM

## 2023-10-31 DIAGNOSIS — F411 Generalized anxiety disorder: Secondary | ICD-10-CM

## 2023-10-31 DIAGNOSIS — E785 Hyperlipidemia, unspecified: Secondary | ICD-10-CM

## 2023-10-31 DIAGNOSIS — R7989 Other specified abnormal findings of blood chemistry: Secondary | ICD-10-CM

## 2023-10-31 DIAGNOSIS — L309 Dermatitis, unspecified: Secondary | ICD-10-CM | POA: Diagnosis not present

## 2023-10-31 DIAGNOSIS — F418 Other specified anxiety disorders: Secondary | ICD-10-CM

## 2023-10-31 LAB — CBC WITH DIFFERENTIAL/PLATELET
Basophils Absolute: 0.1 10*3/uL (ref 0.0–0.1)
Basophils Relative: 1.1 % (ref 0.0–3.0)
Eosinophils Absolute: 0.1 10*3/uL (ref 0.0–0.7)
Eosinophils Relative: 1.6 % (ref 0.0–5.0)
HCT: 43.1 % (ref 39.0–52.0)
Hemoglobin: 14.3 g/dL (ref 13.0–17.0)
Lymphocytes Relative: 23.1 % (ref 12.0–46.0)
Lymphs Abs: 1.1 10*3/uL (ref 0.7–4.0)
MCHC: 33.2 g/dL (ref 30.0–36.0)
MCV: 90.7 fl (ref 78.0–100.0)
Monocytes Absolute: 0.4 10*3/uL (ref 0.1–1.0)
Monocytes Relative: 8.1 % (ref 3.0–12.0)
Neutro Abs: 3.1 10*3/uL (ref 1.4–7.7)
Neutrophils Relative %: 66.1 % (ref 43.0–77.0)
Platelets: 269 10*3/uL (ref 150.0–400.0)
RBC: 4.75 Mil/uL (ref 4.22–5.81)
RDW: 13.4 % (ref 11.5–15.5)
WBC: 4.7 10*3/uL (ref 4.0–10.5)

## 2023-10-31 LAB — PSA: PSA: 0.41 ng/mL (ref 0.10–4.00)

## 2023-10-31 LAB — COMPREHENSIVE METABOLIC PANEL WITH GFR
ALT: 73 U/L — ABNORMAL HIGH (ref 0–53)
AST: 43 U/L — ABNORMAL HIGH (ref 0–37)
Albumin: 4.8 g/dL (ref 3.5–5.2)
Alkaline Phosphatase: 54 U/L (ref 39–117)
BUN: 21 mg/dL (ref 6–23)
CO2: 29 meq/L (ref 19–32)
Calcium: 9.9 mg/dL (ref 8.4–10.5)
Chloride: 104 meq/L (ref 96–112)
Creatinine, Ser: 1.29 mg/dL (ref 0.40–1.50)
GFR: 65.72 mL/min (ref >60.00)
Glucose, Bld: 121 mg/dL — ABNORMAL HIGH (ref 70–99)
Potassium: 4.8 meq/L (ref 3.5–5.1)
Sodium: 141 meq/L (ref 135–145)
Total Bilirubin: 0.6 mg/dL (ref 0.2–1.2)
Total Protein: 7.2 g/dL (ref 6.0–8.3)

## 2023-10-31 LAB — LIPID PANEL
Cholesterol: 135 mg/dL (ref 0–200)
HDL: 41.5 mg/dL (ref >39.00)
LDL Cholesterol: 71 mg/dL (ref 0–99)
NonHDL: 93.05
Total CHOL/HDL Ratio: 3
Triglycerides: 109 mg/dL (ref 0.0–149.0)
VLDL: 21.8 mg/dL (ref 0.0–40.0)

## 2023-10-31 LAB — TSH: TSH: 0.92 u[IU]/mL (ref 0.35–5.50)

## 2023-10-31 MED ORDER — OZEMPIC (0.25 OR 0.5 MG/DOSE) 2 MG/3ML ~~LOC~~ SOPN
PEN_INJECTOR | SUBCUTANEOUS | Status: AC
Start: 2023-10-31 — End: ?

## 2023-10-31 MED ORDER — EPINEPHRINE 0.3 MG/0.3ML IJ SOAJ: 0.3000 mg | INTRAMUSCULAR | 0 refills | Status: AC | PRN

## 2023-10-31 MED ORDER — CLOBETASOL PROPIONATE 0.05 % EX SOLN: 1.0000 | Freq: Two times a day (BID) | CUTANEOUS | 0 refills | Status: DC

## 2023-10-31 MED ORDER — AMPHETAMINE-DEXTROAMPHETAMINE 30 MG PO TABS: 30.0000 mg | ORAL_TABLET | Freq: Three times a day (TID) | ORAL | 0 refills | Status: DC

## 2023-10-31 MED ORDER — ARIPIPRAZOLE 5 MG PO TABS: ORAL_TABLET | ORAL | 1 refills | Status: DC

## 2023-10-31 MED ORDER — ESCITALOPRAM OXALATE 20 MG PO TABS: 20.0000 mg | ORAL_TABLET | Freq: Every day | ORAL | 3 refills | Status: DC

## 2023-10-31 NOTE — Patient Instructions (Signed)
 Living With Attention Deficit Hyperactivity Disorder If you have been diagnosed with attention deficit hyperactivity disorder (ADHD), you may be relieved that you now know why you have felt or behaved a certain way. Still, you may feel overwhelmed about the treatment ahead. You may also wonder how to get the support you need and how to deal with the condition day-to-day. With treatment and support, you can live with ADHD and manage your symptoms. How to manage lifestyle changes Managing lifestyle changes can be challenging. Seeking support from your healthcare provider, therapist, family, and friends can be helpful. How to recognize changes in your condition The following signs may mean that your treatment is working well and your condition is improving: Consistently being on time for appointments. Being more organized at home and work. Other people noticing improvements in your behavior. Achieving goals that you set for yourself. Thinking more clearly. The following signs may mean that your treatment is not working very well: Feeling impatience or more confusion. Missing, forgetting, or being late for appointments. An increasing sense of disorganization and messiness. More difficulty in reaching goals that you set for yourself. Loved ones becoming angry or frustrated with you. Follow these instructions at home: Medicines Take over-the-counter and prescription medicines only as told by your health care provider. Check with your health care provider before taking any new medicines. General instructions Create structure and an organized atmosphere at home. For example: Make a list of tasks, then rank them from most important to least important. Work on one task at a time until your listed tasks are done. Make a daily schedule and follow it consistently every day. Use an appointment calendar, and check it 2-3 times a day to keep on track. Keep it with you when you leave the house. Create  spaces where you keep certain things, and always put things back in their places after you use them. Keep all follow-up visits. Your health care provider will need to monitor your condition and adjust your treatment over time. Where to find support Talking to others  Keep emotion out of important discussions and speak in a calm, logical way. Listen closely and patiently to your loved ones. Try to understand their point of view, and try to avoid getting defensive. Take responsibility for the consequences of your actions. Ask that others do not take your behaviors personally. Aim to solve problems as they come up, and express your feelings instead of bottling them up. Talk openly about what you need from your loved ones and how they can support you. Consider going to family therapy sessions or having your family meet with a specialist who deals with ADHD-related behavior problems. Finances Not all insurance plans cover mental health care, so it is important to check with your insurance carrier. If paying for co-pays or counseling services is a problem, search for a local or county mental health care center. Public mental health care services may be offered there at a low cost or no cost when you are not able to see a private health care provider. If you are taking medicine for ADHD, you may be able to get the generic form, which may be less expensive than brand-name medicine. Some makers of prescription medicines also offer help to patients who cannot afford the medicines that they need. Therapy and support groups Talking with a mental health care provider and participating in support groups can help to improve your quality of life, daily functioning, and overall symptoms. Questions to ask your health  care provider: What are the risks and benefits of taking medicines? Would I benefit from therapy? How often should I follow up with a health care provider? Where to find more information Learn more  about ADHD from: Children and Adults with Attention Deficit Hyperactivity Disorder: [employer] of Mental Health: BloggerCourse.com Centers for Disease Control and Prevention: TonerPromos.no Contact a health care provider if: You have side effects from your medicines, such as: Repeated muscle twitches, coughing, or speech outbursts. Sleep problems. Loss of appetite. Dizziness. Unusually fast heartbeat. Stomach pains. Headaches. You have new or worsening behavior problems. You are struggling with anxiety, depression, or substance abuse. Get help right away if: You have a severe reaction to a medicine. These symptoms may be an emergency. Get help right away. Call 911. Do not wait to see if the symptoms will go away. Do not drive yourself to the hospital. Take one of these steps if you feel like you may hurt yourself or others, or have thoughts about taking your own life: Go to your nearest emergency room. Call 911. Call the National Suicide Prevention Lifeline at (989) 115-0802 or 988. This is open 24 hours a day. Text the Crisis Text Line at 802-008-3988. Summary With treatment and support, you can live with ADHD and manage your symptoms. Consider taking part in family therapy or self-help groups with family members or friends. When you talk with friends and family about your ADHD, be patient and communicate openly. Keep all follow-up visits. Your health care provider will need to monitor your condition and adjust your treatment over time. This information is not intended to replace advice given to you by your health care provider. Make sure you discuss any questions you have with your health care provider. Document Revised: 09/28/2021 Document Reviewed: 09/28/2021 Elsevier Patient Education  2024 [employer].

## 2023-10-31 NOTE — Assessment & Plan Note (Signed)
Stable Con't adderall

## 2023-10-31 NOTE — Assessment & Plan Note (Signed)
 Encourage heart healthy diet such as MIND or DASH diet, increase exercise, avoid trans fats, simple carbohydrates and processed foods, consider a krill or fish or flaxseed oil cap daily.

## 2023-10-31 NOTE — Progress Notes (Signed)
 e  Established Patient Office Visit  Subjective   Patient ID: Kevin Mueller, male    DOB: [birth year]  Age: 48 y.o. MRN: [id number]  Chief Complaint  Patient presents with   ADD   Follow-up    HPI Discussed the use of AI scribe software for clinical note transcription with the patient, who gave verbal consent to proceed.  History of Present Illness Kevin Mueller is a 48 year old male who presents for medication refills and follow-up on current treatments.  He requires refills for Adderall and Lexapro , although he is not yet out of Lexapro . He also uses Abilify , which he finds effective in combination with Lexapro  and Adderall. Missing doses of any of these medications results in feeling 'really off'.  He is taking a compounded form of semaglutide, known as 'Gobi Meds', at a dose of approximately 0.5 mg. He experiences occasional constipation from this medication, which he manages with MiraLAX. He has purchased a six-month supply in advance due to potential changes in availability.  He experiences an elevated heart rate when mobile, described as 'thumping in my ears', but does not know the exact heart rate. He consumes caffeine, including a morning coffee and several Diet Cokes throughout the day, which may contribute to this symptom. No palpitations or chest pain, although his heart rate increases with activity. He does not know the specific measurements of his heart rate or blood pressure during these episodes.  Ciclopirox  did not help with his scalp condition, but using [name]'s prescription clobetasol solution has been effective. He is unsure of the exact formulation but confirms it is a liquid solution.  Socially, he has reduced his alcohol intake and no longer smokes cigars. He consumes caffeine regularly, including coffee and Diet Coke.   Patient Active Problem List   Diagnosis Date Noted   Asperger's disorder 10/08/2022   Eczema 10/08/2022   Anxiety state 10/08/2022    Depression with anxiety 08/08/2021   Morbid obesity (HCC) 08/08/2021   Primary hypertension 11/16/2020   Ganglion cyst of dorsum of left wrist 02/22/2020   Preventative health care 02/16/2016   Otitis media 10/01/2013   Hamartoma of lung (HCC) 10/20/2012   Insomnia 09/26/2012   ADD (attention deficit disorder) 12/18/2010   Hip pain, bilateral 12/18/2010   TRANSAMINASES, SERUM, ELEVATED 07/05/2010   Benign neoplasm of skin 06/08/2010   HIP PAIN, LEFT, CHRONIC 06/08/2010   IBS 04/11/2008   Hyperlipidemia LDL goal <100 05/12/2007   ELEVATED BLOOD PRESSURE WITHOUT DIAGNOSIS OF HYPERTENSION 04/06/2007   Past Medical History:  Diagnosis Date   Elevated blood pressure (not hypertension)    Hamartoma (HCC)    Hamartoma of lung (HCC) 10/20/2012   Hyperlipidemia    IBS (irritable bowel syndrome)    Insomnia 09/26/2012   Primary hypertension 11/16/2020   Past Surgical History:  Procedure Laterality Date   COLONOSCOPY     LUNG BIOPSY     FNA under xray guidance 1998 @ [facility]   SIGMOIDOSCOPY     Social History   Tobacco Use   Smoking status: Former    Current packs/day: 2.00    Average packs/day: 2.0 packs/day for 17.1 years (34.2 ttl pk-yrs)    Types: Cigarettes    Start date: 10/01/2006   Smokeless tobacco: Never  Vaping Use   Vaping status: Never Used  Substance Use Topics   Alcohol use: Yes    Alcohol/week: 4.0 standard drinks of alcohol    Types: 4 Cans of beer per week  Drug use: No   Social History   Socioeconomic History   Marital status: Married    Spouse name: Not on file   Number of children: Not on file   Years of education: Not on file   Highest education level: Master's degree (e.g., MA, MS, MEng, MEd, MSW, MBA)  Occupational History   Occupation: [employer]   Tobacco Use   Smoking status: Former    Current packs/day: 2.00    Average packs/day: 2.0 packs/day for 17.1 years (34.2 ttl pk-yrs)    Types: Cigarettes    Start date:  10/01/2006   Smokeless tobacco: Never  Vaping Use   Vaping status: Never Used  Substance and Sexual Activity   Alcohol use: Yes    Alcohol/week: 4.0 standard drinks of alcohol    Types: 4 Cans of beer per week   Drug use: No   Sexual activity: Yes    Partners: Female  Other Topics Concern   Not on file  Social History Narrative   Exercise---  Some weights,  Running after kids   Social Drivers of Health   Financial Resource Strain: Low Risk  (10/30/2023)   Overall Financial Resource Strain (CARDIA)    Difficulty of Paying Living Expenses: Not very hard  Food Insecurity: No Food Insecurity (10/30/2023)   Hunger Vital Sign    Worried About Running Out of Food in the Last Year: Never true    Ran Out of Food in the Last Year: Never true  Transportation Needs: No Transportation Needs (10/30/2023)   PRAPARE - Administrator, Civil Service (Medical): No    Lack of Transportation (Non-Medical): No  Physical Activity: Insufficiently Active (10/30/2023)   Exercise Vital Sign    Days of Exercise per Week: 1 day    Minutes of Exercise per Session: 30 min  Stress: Stress Concern Present (10/30/2023)   [employer] of Occupational Health - Occupational Stress Questionnaire    Feeling of Stress : Rather much  Social Connections: Socially Isolated (10/30/2023)   Social Connection and Isolation Panel [NHANES]    Frequency of Communication with Friends and Family: Never    Frequency of Social Gatherings with Friends and Family: Never    Attends Religious Services: Never    Database administrator or Organizations: No    Attends Engineer, structural: Not on file    Marital Status: Married  Catering manager Violence: Not on file   Family Status  Relation Name Status   Mother  Alive   Father  Alive   Other  (Not Specified)   Other  (Not Specified)   Other  (Not Specified)   Other  (Not Specified)   Neg Hx  (Not Specified)  No partnership data on file   Family History   Problem Relation Age of Onset   Squamous cell carcinoma Father    High blood pressure Father    Hyperlipidemia Father    Diabetes Other    Hyperlipidemia Other    Cancer Other        PGM ? digestive   Kidney disease Other    Colon cancer Neg Hx    Colon polyps Neg Hx    Esophageal cancer Neg Hx    Rectal cancer Neg Hx    Stomach cancer Neg Hx    Allergies  Allergen Reactions   Codeine     Adverse reaction, per pt   Simvastatin       Review of Systems  Constitutional:  Negative for chills, fever and malaise/fatigue.  HENT:  Negative for congestion and hearing loss.   Eyes:  Negative for blurred vision and discharge.  Respiratory:  Negative for cough, sputum production and shortness of breath.   Cardiovascular:  Negative for chest pain, palpitations and leg swelling.  Gastrointestinal:  Negative for abdominal pain, blood in stool, constipation, diarrhea, heartburn, nausea and vomiting.  Genitourinary:  Negative for dysuria, frequency, hematuria and urgency.  Musculoskeletal:  Negative for back pain, falls and myalgias.  Skin:  Negative for rash.  Neurological:  Negative for dizziness, sensory change, loss of consciousness, weakness and headaches.  Endo/Heme/Allergies:  Negative for environmental allergies. Does not bruise/bleed easily.  Psychiatric/Behavioral:  Negative for depression and suicidal ideas. The patient is not nervous/anxious and does not have insomnia.       Objective:     BP 108/78 (BP Location: Left Arm, Patient Position: Sitting, Cuff Size: Large)   Pulse 94   Temp 98.3 F (36.8 C) (Oral)   Resp 18   Ht 5\' 10"  (1.778 m)   Wt 253 lb (114.8 kg)   SpO2 97%   BMI 36.30 kg/m  BP Readings from Last 3 Encounters:  10/31/23 108/78  03/12/23 114/70  01/23/23 109/76   Wt Readings from Last 3 Encounters:  10/31/23 253 lb (114.8 kg)  03/12/23 258 lb (117 kg)  02/11/23 258 lb (117 kg)      Physical Exam Vitals and nursing note reviewed.   Constitutional:      General: He is not in acute distress.    Appearance: Normal appearance. He is well-developed.  HENT:     Head: Normocephalic and atraumatic.  Eyes:     General: No scleral icterus.       Right eye: No discharge.        Left eye: No discharge.  Cardiovascular:     Rate and Rhythm: Normal rate and regular rhythm.     Heart sounds: No murmur heard. Pulmonary:     Effort: Pulmonary effort is normal. No respiratory distress.     Breath sounds: Normal breath sounds.  Musculoskeletal:        General: Normal range of motion.     Cervical back: Normal range of motion and neck supple.     Right lower leg: No edema.     Left lower leg: No edema.  Skin:    General: Skin is warm and dry.  Neurological:     Mental Status: He is alert and oriented to person, place, and time.  Psychiatric:        Mood and Affect: Mood normal.        Behavior: Behavior normal.        Thought Content: Thought content normal.        Judgment: Judgment normal.      No results found for any visits on 10/31/23.  Last CBC Lab Results  Component Value Date   WBC 3.9 (L) 08/08/2021   HGB 14.7 08/08/2021   HCT 43.4 08/08/2021   MCV 90.7 08/08/2021   MCH 29.7 11/30/2012   RDW 13.4 08/08/2021   PLT 259.0 08/08/2021   Last metabolic panel Lab Results  Component Value Date   GLUCOSE 97 10/08/2022   NA 138 10/08/2022   K 4.7 10/08/2022   CL 103 10/08/2022   CO2 28 10/08/2022   BUN 15 10/08/2022   CREATININE 1.18 10/08/2022   GFR 73.68 10/08/2022   CALCIUM  9.7 10/08/2022   PHOS  3.7 11/30/2012   PROT 7.2 10/08/2022   ALBUMIN 4.6 10/08/2022   BILITOT 0.7 10/08/2022   ALKPHOS 45 10/08/2022   AST 46 (H) 10/08/2022   ALT 68 (H) 10/08/2022   Last lipids Lab Results  Component Value Date   CHOL 175 10/08/2022   HDL 41.00 10/08/2022   LDLCALC 107 (H) 10/08/2022   LDLDIRECT 108.0 04/01/2017   TRIG 136.0 10/08/2022   CHOLHDL 4 10/08/2022   Last hemoglobin A1c Lab Results   Component Value Date   HGBA1C 5.7 08/13/2021   Last thyroid  functions Lab Results  Component Value Date   TSH 1.99 08/08/2021   Last vitamin D Lab Results  Component Value Date   VD25OH 10.22 (L) 08/08/2021   Last vitamin B12 and Folate Lab Results  Component Value Date   VITAMINB12 298 08/08/2021      The 10-year ASCVD risk score (Arnett DK, et al., 2019) is: 2.5%    Assessment & Plan:   Problem List Items Addressed This Visit       Unprioritized   Primary hypertension   Relevant Medications   EPINEPHrine  (EPIPEN  2-PAK) 0.3 mg/0.3 mL IJ SOAJ injection   Other Relevant Orders   Lipid panel   PSA   TSH   Comprehensive metabolic panel with GFR   CBC with Differential/Platelet   Eczema - Primary   Relevant Medications   clobetasol (TEMOVATE) 0.05 % external solution   Anxiety state   Relevant Medications   escitalopram  (LEXAPRO ) 20 MG tablet   Hyperlipidemia LDL goal <100   Encourage heart healthy diet such as MIND or DASH diet, increase exercise, avoid trans fats, simple carbohydrates and processed foods, consider a krill or fish or flaxseed oil cap daily.        Relevant Medications   EPINEPHrine  (EPIPEN  2-PAK) 0.3 mg/0.3 mL IJ SOAJ injection   Depression with anxiety   Stable  Con't lexapro  and abilify        Relevant Medications   escitalopram  (LEXAPRO ) 20 MG tablet   ARIPiprazole  (ABILIFY ) 5 MG tablet   Asperger's disorder   stable      ADD (attention deficit disorder)   Stable Con't adderall      Relevant Medications   amphetamine -dextroamphetamine  (ADDERALL) 30 MG tablet   Other Visit Diagnoses       Attention deficit disorder (ADD) without hyperactivity       Relevant Medications   amphetamine -dextroamphetamine  (ADDERALL) 30 MG tablet     Hyperlipidemia, unspecified hyperlipidemia type       Relevant Medications   EPINEPHrine  (EPIPEN  2-PAK) 0.3 mg/0.3 mL IJ SOAJ injection   Other Relevant Orders   Lipid panel   PSA   Comprehensive  metabolic panel with GFR     Low testosterone        Relevant Orders   PSA     High risk medication use       Relevant Orders   Drug Monitoring Panel [id number] , Urine     Assessment and Plan Assessment & Plan Tachycardia   He reports an increased heart rate during mobility, with a pulse of 94 bpm during the visit. Regular caffeine consumption and Adderall use may contribute to this. Advise reducing caffeine intake, particularly from Diet Coke, and consider caffeine-free alternatives.  Depression   His depression is managed with Lexapro  and Abilify , both of which are effective. Missing doses of Lexapro , Abilify , or Adderall affects his well-being. Continue Lexapro  and Abilify . Schedule an office visit for Lexapro  refills as needed.  ADHD   His ADHD is managed with Adderall, which is effective. Missing doses affects his well-being. Continue Adderall.  Constipation   He is experiencing constipation, possibly due to semaglutide. Currently using MiraLAX with no significant side effects from semaglutide at the current low dose. Recommend over-the-counter options such as Senokot or Dulcolax for daily use. Advise maintaining adequate hydration.  Scalp condition treated with clobetasol   Previous ciclopirox  treatment was ineffective. Clobetasol solution is effective. Continue clobetasol propionate topical solution as prescribed.    Return in about 6 months (around 05/02/2024), or if symptoms worsen or fail to improve.    Rilda Bulls R Lowne Chase, DO

## 2023-10-31 NOTE — Assessment & Plan Note (Signed)
 Stable  Con't lexapro  and abilify 

## 2023-10-31 NOTE — Assessment & Plan Note (Signed)
 stable

## 2023-11-02 LAB — DM TEMPLATE

## 2023-11-03 LAB — DM TEMPLATE

## 2023-11-03 LAB — DRUG MONITORING PANEL 376104, URINE
Barbiturates: NEGATIVE ng/mL (ref <300)
Benzodiazepines: NEGATIVE ng/mL (ref <100)
Cocaine Metabolite: NEGATIVE ng/mL (ref <150)
Desmethyltramadol: NEGATIVE ng/mL (ref <100)
Methamphetamine: NEGATIVE ng/mL (ref <250)
Opiates: NEGATIVE ng/mL (ref <100)
Oxycodone: NEGATIVE ng/mL (ref <100)
Tramadol Comments: >15000 ng/mL — ABNORMAL HIGH (ref <250)
Tramadol: NEGATIVE ng/mL (ref <100)
Tramadol: POSITIVE ng/mL — AB (ref <500)
medMATCH Summary: NEGATIVE ng/mL (ref <100)

## 2023-11-10 ENCOUNTER — Ambulatory Visit: Payer: Self-pay | Admitting: Family Medicine

## 2023-11-10 ENCOUNTER — Other Ambulatory Visit: Payer: Self-pay | Admitting: Family Medicine

## 2023-11-10 DIAGNOSIS — R748 Abnormal levels of other serum enzymes: Secondary | ICD-10-CM

## 2023-12-01 ENCOUNTER — Other Ambulatory Visit: Payer: Self-pay | Admitting: Family Medicine

## 2023-12-01 DIAGNOSIS — E785 Hyperlipidemia, unspecified: Secondary | ICD-10-CM

## 2023-12-10 ENCOUNTER — Other Ambulatory Visit: Payer: Self-pay | Admitting: Family Medicine

## 2023-12-10 DIAGNOSIS — I10 Essential (primary) hypertension: Secondary | ICD-10-CM

## 2024-01-01 ENCOUNTER — Other Ambulatory Visit: Payer: Self-pay | Admitting: Family Medicine

## 2024-01-01 ENCOUNTER — Encounter: Payer: Self-pay | Admitting: Family Medicine

## 2024-01-01 DIAGNOSIS — F988 Other specified behavioral and emotional disorders with onset usually occurring in childhood and adolescence: Secondary | ICD-10-CM

## 2024-01-01 MED ORDER — AMPHETAMINE-DEXTROAMPHETAMINE 30 MG PO TABS: 30.0000 mg | ORAL_TABLET | Freq: Three times a day (TID) | ORAL | 0 refills | Status: DC

## 2024-01-01 NOTE — Telephone Encounter (Signed)
 Requesting: Adderall 30mg   Contract: 10/31/23 UDS: 10/31/23 Last Visit: 10/31/23 Next Visit: None Last Refill: 10/31/23 #90 and 0RF   Please Advise

## 2024-01-19 ENCOUNTER — Other Ambulatory Visit: Payer: Self-pay | Admitting: Family Medicine

## 2024-01-19 DIAGNOSIS — E785 Hyperlipidemia, unspecified: Secondary | ICD-10-CM

## 2024-02-24 ENCOUNTER — Encounter: Payer: Self-pay | Admitting: Family Medicine

## 2024-02-24 DIAGNOSIS — L309 Dermatitis, unspecified: Secondary | ICD-10-CM

## 2024-02-24 DIAGNOSIS — F988 Other specified behavioral and emotional disorders with onset usually occurring in childhood and adolescence: Secondary | ICD-10-CM

## 2024-02-24 DIAGNOSIS — I10 Essential (primary) hypertension: Secondary | ICD-10-CM

## 2024-02-24 DIAGNOSIS — F418 Other specified anxiety disorders: Secondary | ICD-10-CM

## 2024-02-25 MED ORDER — CLOBETASOL PROPIONATE 0.05 % EX SOLN: 1.0000 | Freq: Two times a day (BID) | CUTANEOUS | 0 refills | Status: AC

## 2024-02-25 MED ORDER — LISINOPRIL 20 MG PO TABS: 20.0000 mg | ORAL_TABLET | Freq: Every day | ORAL | 1 refills | Status: AC

## 2024-02-25 MED ORDER — AMPHETAMINE-DEXTROAMPHETAMINE 30 MG PO TABS: 30.0000 mg | ORAL_TABLET | Freq: Three times a day (TID) | ORAL | 0 refills | Status: DC

## 2024-02-25 MED ORDER — ARIPIPRAZOLE 5 MG PO TABS: ORAL_TABLET | ORAL | 1 refills | Status: AC

## 2024-02-25 NOTE — Telephone Encounter (Signed)
 Requesting: Adderall Contract: 10/31/2023 UDS: 10/31/2023 Last OV: 10/31/2023 Next OV: n/a Last Refill: 01/01/24, #90--0 RF Database:   Please advise

## 2024-04-21 ENCOUNTER — Encounter: Payer: Self-pay | Admitting: Family Medicine

## 2024-04-21 DIAGNOSIS — F988 Other specified behavioral and emotional disorders with onset usually occurring in childhood and adolescence: Secondary | ICD-10-CM

## 2024-04-22 ENCOUNTER — Other Ambulatory Visit: Payer: Self-pay | Admitting: Family Medicine

## 2024-04-22 DIAGNOSIS — F988 Other specified behavioral and emotional disorders with onset usually occurring in childhood and adolescence: Secondary | ICD-10-CM

## 2024-04-22 MED ORDER — AMPHETAMINE-DEXTROAMPHETAMINE 30 MG PO TABS: 30.0000 mg | ORAL_TABLET | Freq: Three times a day (TID) | ORAL | 0 refills | Status: DC

## 2024-04-22 NOTE — Telephone Encounter (Signed)
 Requesting: Adderall 30mg   Contract: 10/31/23 UDS: 10/31/23 Last Visit: 10/31/23 Next Visit: None Last Refill: 02/25/24 #90 and 0RF   Please Advise

## 2024-04-26 ENCOUNTER — Other Ambulatory Visit: Payer: Self-pay | Admitting: Medical Genetics

## 2024-04-26 DIAGNOSIS — Z006 Encounter for examination for normal comparison and control in clinical research program: Secondary | ICD-10-CM

## 2024-05-18 ENCOUNTER — Telehealth (INDEPENDENT_AMBULATORY_CARE_PROVIDER_SITE_OTHER): Payer: Self-pay | Admitting: Family Medicine

## 2024-05-18 DIAGNOSIS — F988 Other specified behavioral and emotional disorders with onset usually occurring in childhood and adolescence: Secondary | ICD-10-CM

## 2024-05-18 DIAGNOSIS — F411 Generalized anxiety disorder: Secondary | ICD-10-CM

## 2024-05-18 MED ORDER — AMPHETAMINE-DEXTROAMPHETAMINE 30 MG PO TABS: 30.0000 mg | ORAL_TABLET | Freq: Every day | ORAL | 0 refills | Status: DC

## 2024-05-18 MED ORDER — AMPHETAMINE-DEXTROAMPHETAMINE 30 MG PO TABS: 30.0000 mg | ORAL_TABLET | Freq: Every day | ORAL | 0 refills | Status: AC

## 2024-05-18 MED ORDER — AMPHETAMINE-DEXTROAMPHETAMINE 30 MG PO TABS
30.0000 mg | ORAL_TABLET | Freq: Three times a day (TID) | ORAL | 0 refills | Status: DC
Start: 2024-05-18 — End: 2024-05-18

## 2024-05-18 MED ORDER — AMPHETAMINE-DEXTROAMPHETAMINE 30 MG PO TABS
30.0000 mg | ORAL_TABLET | Freq: Three times a day (TID) | ORAL | 0 refills | Status: DC
Start: 2024-06-17 — End: 2024-05-18

## 2024-05-18 MED ORDER — ESCITALOPRAM OXALATE 20 MG PO TABS: 20.0000 mg | ORAL_TABLET | Freq: Every day | ORAL | 3 refills | Status: AC

## 2024-05-18 MED ORDER — AMPHETAMINE-DEXTROAMPHETAMINE 30 MG PO TABS: 30.0000 mg | ORAL_TABLET | Freq: Three times a day (TID) | ORAL | 0 refills | Status: AC

## 2024-05-18 NOTE — Progress Notes (Signed)
 MyChart Video Visit    Virtual Visit via Video Note   This patient is at least at moderate risk for complications without adequate follow up. This format is felt to be most appropriate for this patient at this time. Physical exam was limited by quality of the video and audio technology used for the visit. Kevin Mueller was able to get the patient set up on a video visit.  Patient location: home  Patient and provider in visit Provider location: Office  I discussed the limitations of evaluation and management by telemedicine and the availability of in person appointments. The patient expressed understanding and agreed to proceed.  Visit Date: 05/18/2024  Today's healthcare provider: Jamee JONELLE Antonio Cyndee, DO     Subjective:    Patient ID: Kevin Mueller, male    DOB: 07-06-1975, 48 y.o.   MRN: [id number]  No chief complaint on file.   HPI Patient is in today for f/u add meds.  Discussed the use of AI scribe software for clinical note transcription with the patient, who gave verbal consent to proceed.  History of Present Illness Kevin Mueller is a 48 year old male who presents for a follow-up regarding Adderall use.  He is unsure if he needs a refill of Adderall but believes he might be close to running out. He takes Adderall 30 mg three times a day.  He discusses his use of Lexapro , which he believes might not be out yet, but he is checking on the refill status. He takes several other medications including simvastatin, ranofapril, fenofibrate , and aripiprazole  (Abilify ). He mentions taking 'like seven different pills a day'.    Past Medical History:  Diagnosis Date   Elevated blood pressure (not hypertension)    Hamartoma (HCC)    Hamartoma of lung (HCC) 10/20/2012   Hyperlipidemia    IBS (irritable bowel syndrome)    Insomnia 09/26/2012   Primary hypertension 11/16/2020    Past Surgical History:  Procedure Laterality Date   COLONOSCOPY     LUNG BIOPSY     FNA under  xray guidance 1998 @ [facility]   SIGMOIDOSCOPY      Family History  Problem Relation Age of Onset   Squamous cell carcinoma Father    High blood pressure Father    Hyperlipidemia Father    Diabetes Other    Hyperlipidemia Other    Cancer Other        PGM ? digestive   Kidney disease Other    Colon cancer Neg Hx    Colon polyps Neg Hx    Esophageal cancer Neg Hx    Rectal cancer Neg Hx    Stomach cancer Neg Hx     Social History   Socioeconomic History   Marital status: Married    Spouse name: Not on file   Number of children: Not on file   Years of education: Not on file   Highest education level: Master's degree (e.g., MA, MS, MEng, MEd, MSW, MBA)  Occupational History   Occupation: [employer]   Tobacco Use   Smoking status: Former    Current packs/day: 2.00    Average packs/day: 2.0 packs/day for 17.6 years (35.3 ttl pk-yrs)    Types: Cigarettes    Start date: 10/01/2006   Smokeless tobacco: Never  Vaping Use   Vaping status: Never Used  Substance and Sexual Activity   Alcohol use: Yes    Alcohol/week: 4.0 standard drinks of alcohol    Types:  4 Cans of beer per week   Drug use: No   Sexual activity: Yes    Partners: Female  Other Topics Concern   Not on file  Social History Narrative   Exercise---  Some weights,  Running after kids   Social Drivers of Health   Financial Resource Strain: Low Risk  (10/30/2023)   Overall Financial Resource Strain (CARDIA)    Difficulty of Paying Living Expenses: Not very hard  Food Insecurity: No Food Insecurity (10/30/2023)   Hunger Vital Sign    Worried About Running Out of Food in the Last Year: Never true    Ran Out of Food in the Last Year: Never true  Transportation Needs: No Transportation Needs (10/30/2023)   PRAPARE - Administrator, Civil Service (Medical): No    Lack of Transportation (Non-Medical): No  Physical Activity: Insufficiently Active (10/30/2023)   Exercise Vital Sign    Days  of Exercise per Week: 1 day    Minutes of Exercise per Session: 30 min  Stress: Stress Concern Present (10/30/2023)   [employer] of Occupational Health - Occupational Stress Questionnaire    Feeling of Stress : Rather much  Social Connections: Socially Isolated (10/30/2023)   Social Connection and Isolation Panel    Frequency of Communication with Friends and Family: Never    Frequency of Social Gatherings with Friends and Family: Never    Attends Religious Services: Never    Database Administrator or Organizations: No    Attends Engineer, Structural: Not on file    Marital Status: Married  Catering Manager Violence: Not on file    Outpatient Medications Prior to Visit  Medication Sig Dispense Refill   ALPRAZolam  (XANAX ) 0.25 MG tablet Take 1 tablet (0.25 mg total) by mouth 2 (two) times daily as needed for anxiety. 20 tablet 0   ARIPiprazole  (ABILIFY ) 5 MG tablet TAKE 1 TABLET(5 MG) BY MOUTH DAILY 90 tablet 1   Ciclopirox  1 % shampoo Apply 1 Dose topically at bedtime. 120 mL 0   clobetasol  (TEMOVATE ) 0.05 % external solution Apply 1 Application topically 2 (two) times daily. 50 mL 0   EPINEPHrine  (EPIPEN  2-PAK) 0.3 mg/0.3 mL IJ SOAJ injection Inject 0.3 mg into the muscle as needed for anaphylaxis. 1 each 0   fenofibrate  160 MG tablet Take 1 tablet (160 mg total) by mouth daily. 90 tablet 1   fluticasone  (FLONASE ) 50 MCG/ACT nasal spray Place 2 sprays into both nostrils daily. 16 g 6   hydrocortisone  (ANUSOL -HC) 2.5 % rectal cream Place 1 Application rectally 2 (two) times daily. Use Twice daily for 7 days. 30 g 0   lisinopril  (ZESTRIL ) 20 MG tablet Take 1 tablet (20 mg total) by mouth daily. 90 tablet 1   rosuvastatin  (CRESTOR ) 10 MG tablet Take 1 tablet (10 mg total) by mouth daily. 90 tablet 1   Semaglutide ,0.25 or 0.5MG /DOS, (OZEMPIC , 0.25 OR 0.5 MG/DOSE,) 2 MG/3ML SOPN 0.5 mg sq weekly     testosterone  (ANDROGEL ) 50 MG/5GM (1%) GEL Place 5 g onto the skin daily.      amphetamine -dextroamphetamine  (ADDERALL) 30 MG tablet Take 1 tablet by mouth 3 (three) times daily. 90 tablet 0   escitalopram  (LEXAPRO ) 20 MG tablet Take 1 tablet (20 mg total) by mouth daily. Pt needs office visit for further refills 90 tablet 3   No facility-administered medications prior to visit.    Allergies  Allergen Reactions   Codeine     Adverse reaction, per  pt   Simvastatin     Review of Systems  Constitutional:  Negative for fever and malaise/fatigue.  HENT:  Negative for congestion.   Eyes:  Negative for blurred vision.  Respiratory:  Negative for shortness of breath.   Cardiovascular:  Negative for chest pain, palpitations and leg swelling.  Gastrointestinal:  Negative for abdominal pain, blood in stool and nausea.  Genitourinary:  Negative for dysuria and frequency.  Musculoskeletal:  Negative for falls.  Skin:  Negative for rash.  Neurological:  Negative for dizziness, loss of consciousness and headaches.  Endo/Heme/Allergies:  Negative for environmental allergies.  Psychiatric/Behavioral:  Negative for depression, hallucinations, memory loss, substance abuse and suicidal ideas. The patient is not nervous/anxious and does not have insomnia.        Objective:    Physical Exam Vitals and nursing note reviewed.  Constitutional:      General: He is not in acute distress.    Appearance: Normal appearance. He is well-developed.  HENT:     Head: Normocephalic and atraumatic.  Eyes:     General: No scleral icterus.       Right eye: No discharge.        Left eye: No discharge.  Cardiovascular:     Rate and Rhythm: Normal rate and regular rhythm.     Heart sounds: No murmur heard. Pulmonary:     Effort: Pulmonary effort is normal. No respiratory distress.     Breath sounds: Normal breath sounds.  Musculoskeletal:        General: Normal range of motion.     Cervical back: Normal range of motion and neck supple.     Right lower leg: No edema.     Left lower  leg: No edema.  Skin:    General: Skin is warm and dry.  Neurological:     Mental Status: He is alert and oriented to person, place, and time.  Psychiatric:        Mood and Affect: Mood normal.        Behavior: Behavior normal.        Thought Content: Thought content normal.        Judgment: Judgment normal.     There were no vitals taken for this visit. Wt Readings from Last 3 Encounters:  10/31/23 253 lb (114.8 kg)  03/12/23 258 lb (117 kg)  02/11/23 258 lb (117 kg)       Assessment & Plan:  Attention deficit disorder, unspecified type Assessment & Plan: Con't adderall  F/u 6 months or sooner as needed    Attention deficit disorder (ADD) without hyperactivity -     Amphetamine -Dextroamphetamine ; Take 1 tablet by mouth 3 (three) times daily.  Dispense: 90 tablet; Refill: 0 -     Amphetamine -Dextroamphetamine ; Take 1 tablet by mouth daily.  Dispense: 90 tablet; Refill: 0  Anxiety state -     Escitalopram  Oxalate; Take 1 tablet (20 mg total) by mouth daily. Pt needs office visit for further refills  Dispense: 90 tablet; Refill: 3   Assessment and Plan Assessment & Plan Attention-deficit hyperactivity disorder, predominantly inattentive type   Continued management with Adderall. Refilled Adderall prescription for three months, 30 mg three times a day.  Generalized anxiety disorder   Continued management with Lexapro . Refilled Lexapro  prescription.  Depressive disorder, unspecified   Continued management with Lexapro . Refilled Lexapro  prescription.    I discussed the assessment and treatment plan with the patient. The patient was provided an opportunity to ask questions and  all were answered. The patient agreed with the plan and demonstrated an understanding of the instructions.   The patient was advised to call back or seek an in-person evaluation if the symptoms worsen or if the condition fails to improve as anticipated.  Kevin JONELLE Antonio Cyndee, DO [place]  [facility] [phone] (phone) [phone] (fax)  [facility]

## 2024-05-18 NOTE — Progress Notes (Deleted)
 Subjective:    Patient ID: Kevin Mueller, male    DOB: January 18, 1976, 48 y.o.   MRN: [id number]  No chief complaint on file.   HPI Patient is in today for ***  Past Medical History:  Diagnosis Date   Elevated blood pressure (not hypertension)    Hamartoma (HCC)    Hamartoma of lung (HCC) 10/20/2012   Hyperlipidemia    IBS (irritable bowel syndrome)    Insomnia 09/26/2012   Primary hypertension 11/16/2020    Past Surgical History:  Procedure Laterality Date   COLONOSCOPY     LUNG BIOPSY     FNA under xray guidance 1998 @ [facility]   SIGMOIDOSCOPY      Family History  Problem Relation Age of Onset   Squamous cell carcinoma Father    High blood pressure Father    Hyperlipidemia Father    Diabetes Other    Hyperlipidemia Other    Cancer Other        PGM ? digestive   Kidney disease Other    Colon cancer Neg Hx    Colon polyps Neg Hx    Esophageal cancer Neg Hx    Rectal cancer Neg Hx    Stomach cancer Neg Hx     Social History   Socioeconomic History   Marital status: Married    Spouse name: Not on file   Number of children: Not on file   Years of education: Not on file   Highest education level: Master's degree (e.g., MA, MS, MEng, MEd, MSW, MBA)  Occupational History   Occupation: [employer]   Tobacco Use   Smoking status: Former    Current packs/day: 2.00    Average packs/day: 2.0 packs/day for 17.6 years (35.3 ttl pk-yrs)    Types: Cigarettes    Start date: 10/01/2006   Smokeless tobacco: Never  Vaping Use   Vaping status: Never Used  Substance and Sexual Activity   Alcohol use: Yes    Alcohol/week: 4.0 standard drinks of alcohol    Types: 4 Cans of beer per week   Drug use: No   Sexual activity: Yes    Partners: Female  Other Topics Concern   Not on file  Social History Narrative   Exercise---  Some weights,  Running after kids   Social Drivers of Health   Financial Resource Strain: Low Risk  (10/30/2023)   Overall  Financial Resource Strain (CARDIA)    Difficulty of Paying Living Expenses: Not very hard  Food Insecurity: No Food Insecurity (10/30/2023)   Hunger Vital Sign    Worried About Running Out of Food in the Last Year: Never true    Ran Out of Food in the Last Year: Never true  Transportation Needs: No Transportation Needs (10/30/2023)   PRAPARE - Administrator, Civil Service (Medical): No    Lack of Transportation (Non-Medical): No  Physical Activity: Insufficiently Active (10/30/2023)   Exercise Vital Sign    Days of Exercise per Week: 1 day    Minutes of Exercise per Session: 30 min  Stress: Stress Concern Present (10/30/2023)   [employer] of Occupational Health - Occupational Stress Questionnaire    Feeling of Stress : Rather much  Social Connections: Socially Isolated (10/30/2023)   Social Connection and Isolation Panel    Frequency of Communication with Friends and Family: Never    Frequency of Social Gatherings with Friends and Family: Never    Attends Religious Services: Never  Active Member of Clubs or Organizations: No    Attends Banker Meetings: Not on file    Marital Status: Married  Catering Manager Violence: Not on file    Outpatient Medications Prior to Visit  Medication Sig Dispense Refill   ALPRAZolam  (XANAX ) 0.25 MG tablet Take 1 tablet (0.25 mg total) by mouth 2 (two) times daily as needed for anxiety. 20 tablet 0   amphetamine -dextroamphetamine  (ADDERALL) 30 MG tablet Take 1 tablet by mouth 3 (three) times daily. 90 tablet 0   ARIPiprazole  (ABILIFY ) 5 MG tablet TAKE 1 TABLET(5 MG) BY MOUTH DAILY 90 tablet 1   Ciclopirox  1 % shampoo Apply 1 Dose topically at bedtime. 120 mL 0   clobetasol  (TEMOVATE ) 0.05 % external solution Apply 1 Application topically 2 (two) times daily. 50 mL 0   EPINEPHrine  (EPIPEN  2-PAK) 0.3 mg/0.3 mL IJ SOAJ injection Inject 0.3 mg into the muscle as needed for anaphylaxis. 1 each 0   escitalopram  (LEXAPRO ) 20 MG  tablet Take 1 tablet (20 mg total) by mouth daily. Pt needs office visit for further refills 90 tablet 3   fenofibrate  160 MG tablet Take 1 tablet (160 mg total) by mouth daily. 90 tablet 1   fluticasone  (FLONASE ) 50 MCG/ACT nasal spray Place 2 sprays into both nostrils daily. 16 g 6   hydrocortisone  (ANUSOL -HC) 2.5 % rectal cream Place 1 Application rectally 2 (two) times daily. Use Twice daily for 7 days. 30 g 0   lisinopril  (ZESTRIL ) 20 MG tablet Take 1 tablet (20 mg total) by mouth daily. 90 tablet 1   rosuvastatin  (CRESTOR ) 10 MG tablet Take 1 tablet (10 mg total) by mouth daily. 90 tablet 1   Semaglutide ,0.25 or 0.5MG /DOS, (OZEMPIC , 0.25 OR 0.5 MG/DOSE,) 2 MG/3ML SOPN 0.5 mg sq weekly     testosterone  (ANDROGEL ) 50 MG/5GM (1%) GEL Place 5 g onto the skin daily.     No facility-administered medications prior to visit.    Allergies  Allergen Reactions   Codeine     Adverse reaction, per pt   Simvastatin     ROS     Objective:    Physical Exam  There were no vitals taken for this visit. Wt Readings from Last 3 Encounters:  10/31/23 253 lb (114.8 kg)  03/12/23 258 lb (117 kg)  02/11/23 258 lb (117 kg)    Diabetic Foot Exam - Simple   No data filed    Lab Results  Component Value Date   WBC 4.7 10/31/2023   HGB 14.3 10/31/2023   HCT 43.1 10/31/2023   PLT 269.0 10/31/2023   GLUCOSE 121 (H) 10/31/2023   CHOL 135 10/31/2023   TRIG 109.0 10/31/2023   HDL 41.50 10/31/2023   LDLDIRECT 108.0 04/01/2017   LDLCALC 71 10/31/2023   ALT 73 (H) 10/31/2023   AST 43 (H) 10/31/2023   NA 141 10/31/2023   K 4.8 10/31/2023   CL 104 10/31/2023   CREATININE 1.29 10/31/2023   BUN 21 10/31/2023   CO2 29 10/31/2023   TSH 0.92 10/31/2023   PSA 0.41 10/31/2023   HGBA1C 5.7 08/13/2021    Lab Results  Component Value Date   TSH 0.92 10/31/2023   Lab Results  Component Value Date   WBC 4.7 10/31/2023   HGB 14.3 10/31/2023   HCT 43.1 10/31/2023   MCV 90.7 10/31/2023   PLT  269.0 10/31/2023   Lab Results  Component Value Date   NA 141 10/31/2023   K 4.8 10/31/2023  CO2 29 10/31/2023   GLUCOSE 121 (H) 10/31/2023   BUN 21 10/31/2023   CREATININE 1.29 10/31/2023   BILITOT 0.6 10/31/2023   ALKPHOS 54 10/31/2023   AST 43 (H) 10/31/2023   ALT 73 (H) 10/31/2023   PROT 7.2 10/31/2023   ALBUMIN 4.8 10/31/2023   CALCIUM  9.9 10/31/2023   GFR 65.72 10/31/2023   Lab Results  Component Value Date   CHOL 135 10/31/2023   Lab Results  Component Value Date   HDL 41.50 10/31/2023   Lab Results  Component Value Date   LDLCALC 71 10/31/2023   Lab Results  Component Value Date   TRIG 109.0 10/31/2023   Lab Results  Component Value Date   CHOLHDL 3 10/31/2023   Lab Results  Component Value Date   HGBA1C 5.7 08/13/2021       Assessment & Plan:  There are no diagnoses linked to this encounter.  Jesyka Slaght R Lowne Chase, DO

## 2024-05-18 NOTE — Assessment & Plan Note (Signed)
 Con't adderall  F/u 6 months or sooner as needed

## 2024-06-18 ENCOUNTER — Other Ambulatory Visit: Payer: Self-pay | Admitting: Family Medicine

## 2024-06-18 DIAGNOSIS — E785 Hyperlipidemia, unspecified: Secondary | ICD-10-CM

## 2024-07-27 ENCOUNTER — Other Ambulatory Visit: Payer: Self-pay | Admitting: Family Medicine

## 2024-07-27 DIAGNOSIS — E785 Hyperlipidemia, unspecified: Secondary | ICD-10-CM
